# Patient Record
Sex: Male | Born: 1950 | Race: White | Hispanic: No | Smoking: Never smoker
Health system: Southern US, Community
[De-identification: ages and names within clinical notes are randomized; demographics above are authoritative.]

## PROBLEM LIST (undated history)

## (undated) DIAGNOSIS — L409 Psoriasis, unspecified: Secondary | ICD-10-CM

## (undated) DIAGNOSIS — E785 Hyperlipidemia, unspecified: Secondary | ICD-10-CM

## (undated) DIAGNOSIS — I8390 Asymptomatic varicose veins of unspecified lower extremity: Secondary | ICD-10-CM

## (undated) DIAGNOSIS — G3184 Mild cognitive impairment, so stated: Secondary | ICD-10-CM

## (undated) DIAGNOSIS — F329 Major depressive disorder, single episode, unspecified: Secondary | ICD-10-CM

## (undated) DIAGNOSIS — J189 Pneumonia, unspecified organism: Secondary | ICD-10-CM

## (undated) DIAGNOSIS — M23309 Other meniscus derangements, unspecified meniscus, unspecified knee: Secondary | ICD-10-CM

## (undated) DIAGNOSIS — N400 Enlarged prostate without lower urinary tract symptoms: Secondary | ICD-10-CM

## (undated) DIAGNOSIS — I10 Essential (primary) hypertension: Secondary | ICD-10-CM

## (undated) HISTORY — DX: Asymptomatic varicose veins of unspecified lower extremity: I83.90

## (undated) HISTORY — DX: Psoriasis, unspecified: L40.9

## (undated) HISTORY — PX: APPENDECTOMY: SHX54

## (undated) HISTORY — DX: Other meniscus derangements, unspecified meniscus, unspecified knee: M23.309

## (undated) HISTORY — PX: LOBECTOMY: SHX5089

---

## 1992-01-04 HISTORY — PX: OTHER SURGICAL HISTORY: SHX169

## 2000-11-10 ENCOUNTER — Encounter: Payer: Self-pay | Admitting: Geriatric Medicine

## 2000-11-10 ENCOUNTER — Encounter: Admission: RE | Admit: 2000-11-10 | Discharge: 2000-11-10 | Payer: Self-pay | Admitting: Geriatric Medicine

## 2000-11-16 ENCOUNTER — Encounter: Payer: Self-pay | Admitting: Geriatric Medicine

## 2000-11-16 ENCOUNTER — Encounter: Admission: RE | Admit: 2000-11-16 | Discharge: 2000-11-16 | Payer: Self-pay | Admitting: Geriatric Medicine

## 2000-12-18 ENCOUNTER — Ambulatory Visit (HOSPITAL_COMMUNITY): Admission: RE | Admit: 2000-12-18 | Discharge: 2000-12-18 | Payer: Self-pay | Admitting: Gastroenterology

## 2003-09-23 ENCOUNTER — Ambulatory Visit (HOSPITAL_COMMUNITY): Admission: RE | Admit: 2003-09-23 | Discharge: 2003-09-23 | Payer: Self-pay | Admitting: Geriatric Medicine

## 2003-12-02 ENCOUNTER — Ambulatory Visit (HOSPITAL_COMMUNITY): Admission: RE | Admit: 2003-12-02 | Discharge: 2003-12-02 | Payer: Self-pay | Admitting: Neurology

## 2003-12-10 ENCOUNTER — Encounter: Admission: RE | Admit: 2003-12-10 | Discharge: 2003-12-10 | Payer: Self-pay | Admitting: Neurology

## 2004-07-20 ENCOUNTER — Encounter: Admission: RE | Admit: 2004-07-20 | Discharge: 2004-07-20 | Payer: Self-pay | Admitting: Geriatric Medicine

## 2005-12-08 ENCOUNTER — Encounter: Admission: RE | Admit: 2005-12-08 | Discharge: 2005-12-08 | Payer: Self-pay | Admitting: Geriatric Medicine

## 2009-01-03 HISTORY — PX: CHOLECYSTECTOMY: SHX55

## 2009-03-24 ENCOUNTER — Inpatient Hospital Stay (HOSPITAL_COMMUNITY): Admission: EM | Admit: 2009-03-24 | Discharge: 2009-03-26 | Payer: Self-pay | Admitting: Emergency Medicine

## 2009-03-25 ENCOUNTER — Encounter (INDEPENDENT_AMBULATORY_CARE_PROVIDER_SITE_OTHER): Payer: Self-pay | Admitting: Surgery

## 2010-01-23 ENCOUNTER — Encounter: Payer: Self-pay | Admitting: Neurology

## 2010-03-29 LAB — CBC
Hemoglobin: 14.4 g/dL (ref 13.0–17.0)
Hemoglobin: 16 g/dL (ref 13.0–17.0)
MCHC: 33.6 g/dL (ref 30.0–36.0)
MCHC: 34 g/dL (ref 30.0–36.0)
Platelets: 145 10*3/uL — ABNORMAL LOW (ref 150–400)
RBC: 4.61 MIL/uL (ref 4.22–5.81)
RDW: 13.6 % (ref 11.5–15.5)

## 2010-03-29 LAB — COMPREHENSIVE METABOLIC PANEL
ALT: 283 U/L — ABNORMAL HIGH (ref 0–53)
ALT: 355 U/L — ABNORMAL HIGH (ref 0–53)
AST: 226 U/L — ABNORMAL HIGH (ref 0–37)
Alkaline Phosphatase: 106 U/L (ref 39–117)
BUN: 5 mg/dL — ABNORMAL LOW (ref 6–23)
CO2: 24 mEq/L (ref 19–32)
CO2: 28 mEq/L (ref 19–32)
Calcium: 8.7 mg/dL (ref 8.4–10.5)
Chloride: 104 mEq/L (ref 96–112)
Chloride: 107 mEq/L (ref 96–112)
Creatinine, Ser: 1.1 mg/dL (ref 0.4–1.5)
GFR calc Af Amer: >60 mL/min (ref >60)
GFR calc non Af Amer: >60 mL/min (ref >60)
Glucose, Bld: 102 mg/dL — ABNORMAL HIGH (ref 70–99)
Glucose, Bld: 123 mg/dL — ABNORMAL HIGH (ref 70–99)
Potassium: 3.6 mEq/L (ref 3.5–5.1)
Potassium: 3.7 mEq/L (ref 3.5–5.1)
Sodium: 142 mEq/L (ref 135–145)
Total Bilirubin: 2.4 mg/dL — ABNORMAL HIGH (ref 0.3–1.2)
Total Protein: 6.4 g/dL (ref 6.0–8.3)

## 2010-03-29 LAB — POTASSIUM: Potassium: 3.9 mEq/L (ref 3.5–5.1)

## 2010-03-29 LAB — LIPASE, BLOOD: Lipase: 31 U/L (ref 11–59)

## 2010-03-29 LAB — URINALYSIS, ROUTINE W REFLEX MICROSCOPIC
Ketones, ur: NEGATIVE mg/dL
Nitrite: NEGATIVE
Protein, ur: 100 mg/dL — AB
Urobilinogen, UA: 1 mg/dL (ref 0.0–1.0)

## 2010-03-29 LAB — DIFFERENTIAL
Basophils Absolute: 0 10*3/uL (ref 0.0–0.1)
Basophils Relative: 0 % (ref 0–1)
Lymphocytes Relative: 11 % — ABNORMAL LOW (ref 12–46)
Monocytes Absolute: 0.4 10*3/uL (ref 0.1–1.0)
Monocytes Relative: 5 % (ref 3–12)
Neutro Abs: 7.4 10*3/uL (ref 1.7–7.7)

## 2010-03-29 LAB — URINE MICROSCOPIC-ADD ON

## 2010-03-29 LAB — POCT CARDIAC MARKERS: Myoglobin, poc: 210 ng/mL (ref 12–200)

## 2010-03-29 LAB — HEPATIC FUNCTION PANEL
ALT: 276 U/L — ABNORMAL HIGH (ref 0–53)
Total Bilirubin: 1.9 mg/dL — ABNORMAL HIGH (ref 0.3–1.2)

## 2010-05-21 NOTE — Procedures (Signed)
[facility]  Patient:    Scott Ibarra, WONG Visit Number: [id number] MRN: [id number]          Service Type: Attending:  Verlin Grills, M.D. Dictated by:   Verlin Grills, M.D. Proc. Date: 12/18/00   CC:         Hal T. Stoneking, M.D.   Procedure Report  PROCEDURE:  Screening colonoscopy.  REFERRING PHYSICIAN:  Hal T. Stoneking, M.D.  INDICATIONS FOR PROCEDURE:  The patient (date of birth, 20-Oct-1950) is a 60 year old male who is due for his first screening colonoscopy with polypectomy to prevent colon cancer.  The patients father died of rectal cancer in his mid 52s.  I discussed with the patient the complications associated with colonoscopy and polypectomy including a 15 per 1000 risk of bleeding and 4 per 1000 risk of colon perforation requiring surgical repair.  The patient has signed the operative permit.  ENDOSCOPIST:  Verlin Grills, M.D.  PREMEDICATION:  Demerol 50 mg and Versed 7.5 mg.  ENDOSCOPE:  Olympus pediatric colonoscope.  DESCRIPTION OF PROCEDURE:  After obtaining informed consent, the patient was placed in the left lateral decubitus position.  I administered intravenous Versed and intravenous Demerol to achieve conscious sedation for the procedure.  The patients blood pressure, oxygen saturation, and cardiac rhythm were monitored throughout the procedure, and documented in the medical record.  Anal inspection was normal.  Digital rectal exam revealed a non-nodular prostate.  The Olympus pediatric video colonoscope was introduced into the rectum and easily advanced to the cecum.  Colonic preparation for the exam today was excellent.  Rectum normal.  Sigmoid colon and descending colon:  Left colonic diverticulosis.  Splenic flexure normal.  Transverse colon normal.  Hepatic flexure normal.  Ascending colon normal.  Cecum and ileocecal valve normal.  ASSESSMENT:  Normal proctocolonoscopy to  the cecum except for the presence of left colonic diverticulosis.  No endoscopic evidence for the presence of colorectal neoplasia.  RECOMMENDATIONS:  Repeat colonoscopy in approximately five years. Dictated by:   Verlin Grills, M.D. Attending:  Verlin Grills, M.D. DD:  12/18/00 TD:  12/19/00 Job: [id number]

## 2010-07-22 ENCOUNTER — Other Ambulatory Visit: Payer: Self-pay | Admitting: Gastroenterology

## 2010-07-22 ENCOUNTER — Ambulatory Visit (HOSPITAL_COMMUNITY)
Admission: RE | Admit: 2010-07-22 | Discharge: 2010-07-22 | Disposition: A | Discharge status: Home / Self Care | Payer: Managed Care, Other (non HMO) | Source: Ambulatory Visit | Attending: Gastroenterology | Admitting: Gastroenterology

## 2010-07-22 DIAGNOSIS — K621 Rectal polyp: Secondary | ICD-10-CM | POA: Insufficient documentation

## 2010-07-22 DIAGNOSIS — K620 Anal polyp: Secondary | ICD-10-CM | POA: Insufficient documentation

## 2010-07-22 DIAGNOSIS — K5730 Diverticulosis of large intestine without perforation or abscess without bleeding: Secondary | ICD-10-CM | POA: Insufficient documentation

## 2010-07-22 DIAGNOSIS — D126 Benign neoplasm of colon, unspecified: Secondary | ICD-10-CM | POA: Insufficient documentation

## 2010-08-05 NOTE — Op Note (Signed)
  Scott Ibarra, Scott Ibarra              ACCOUNT NO.:  [id number]  MEDICAL RECORD NO.:  [id number]  LOCATION:  [facility]                         FACILITY:  [facility]  PHYSICIAN:  Danise Edge, M.D.   DATE OF BIRTH:  1950-02-03  DATE OF PROCEDURE:  07/22/2010 DATE OF DISCHARGE:                              OPERATIVE REPORT   REFERRING PHYSICIAN:  Hal T. Stoneking, M.D.  HISTORY:  [name] is a 60 year old male born [birth year]. The patient is scheduled to undergo a surveillance colonoscopy with polypectomy to prevent colon cancer.  On June 13, 2005, the patient underwent a screening colonoscopy with removal of a diminutive polyp from the sigmoid colon and a diminutive polyp from the rectum.  ENDOSCOPIST:  Danise Edge, M.D.  PREMEDICATIONS:  Fentanyl 75 mcg, Versed 5 mg.  PROCEDURE:  After obtaining informed consent, the patient was placed in the left lateral decubitus position.  Anal inspection and digital rectal exam were normal.  The Pentax pediatric colonoscope was introduced into the rectum and easily advanced to the cecum.  A normal-appearing ileocecal valve and appendiceal orifice were identified.  Colonic preparation for the exam today was good.  Rectum.  A 3-mm sessile polyp was removed from the distal rectum.  Sigmoid colon.  Colonic diverticulosis.  From the mid sigmoid colon, a 3- mm sessile polyp was removed with cold biopsy forceps.  Descending colon.  Colonic diverticulosis.  Splenic flexure normal.  Transverse colon.  Colonic diverticulosis.  Hepatic flexure normal.  Ascending colon.  Colonic diverticulosis.  From the distal ascending colon, a 3-mm sessile polyp was removed with cold biopsy forceps.  Cecum and ileocecal valve normal.  ASSESSMENT: 1. Universal colonic diverticulosis. 2. A diminutive polyp was removed from the distal ascending colon, mid     sigmoid colon, and distal rectum.  RECOMMENDATIONS:  Repeat surveillance colonoscopy in 5  years.          ______________________________ Danise Edge, M.D.     MJ/MEDQ  D:  07/22/2010  T:  07/22/2010  Job:  [id number]  cc:   Hal T. Stoneking, M.D. Fax: [phone]  Electronically Signed by Danise Edge M.D. on 08/05/2010 04:13:44 PM

## 2011-01-04 HISTORY — PX: VARICOSE VEIN SURGERY: SHX832

## 2011-03-15 IMAGING — CT CT ABD-PELV W/ CM
1 of 3 series · 14 of 32 positions shown, 19 images · IV contrast (agent unspecified)
Comparison: None.

CLINICAL DATA: Upper abdominal pain with nausea.

CT ABDOMEN AND PELVIS WITH CONTRAST
TECHNIQUE: Multidetector CT imaging of the abdomen and pelvis was
performed following the standard protocol during bolus
administration of intravenous contrast.
Contrast: 100 ml [id number]-[id number] IV.

[Series 2: abd/pelv with 5.0 b31f st · axial · 0.93mm/px · z∈[-360,+60]mm · 14 of 94 slices shown, 19 images]
[im 5/94  soft-tissue]
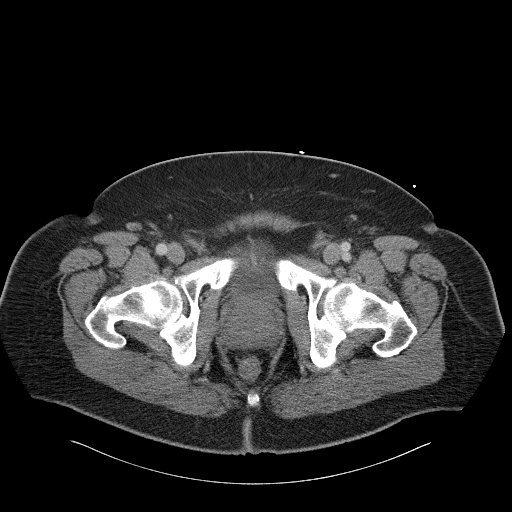
[im 5/94  bone]
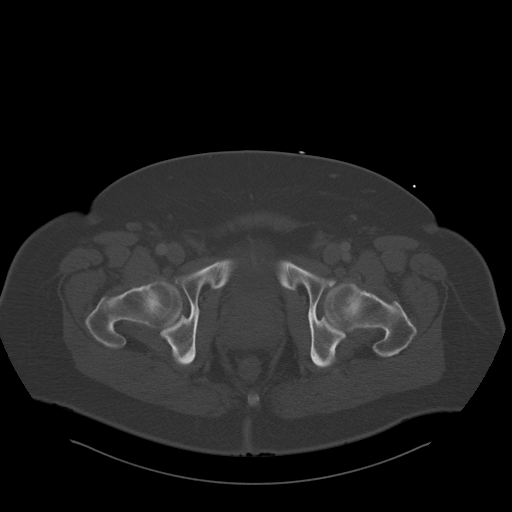
[im 15/94  soft-tissue]
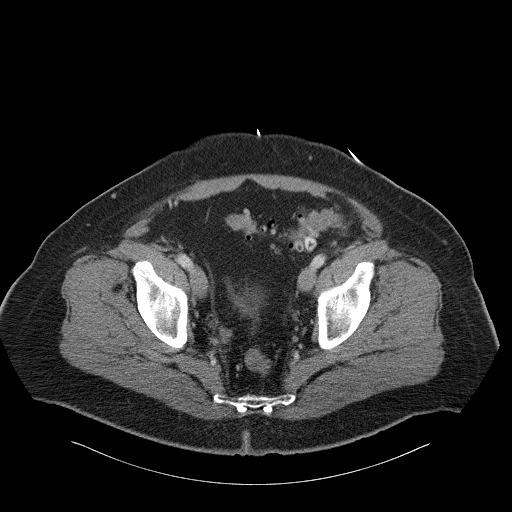
[im 20/94  soft-tissue]
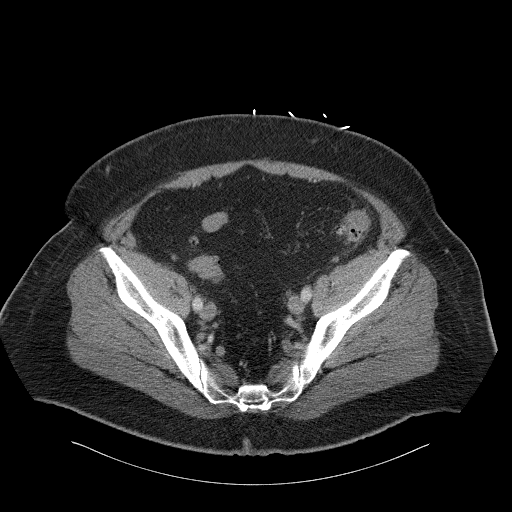
[im 25/94  soft-tissue]
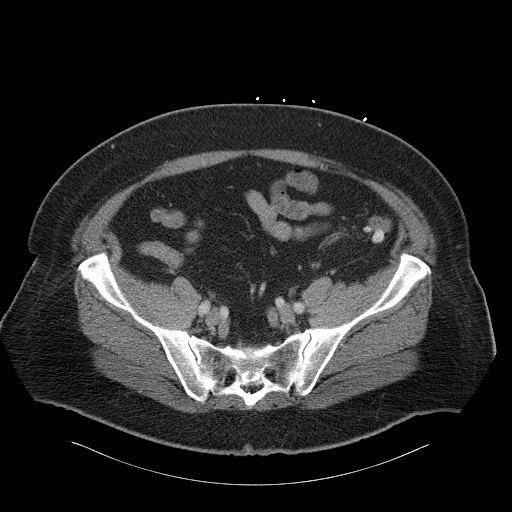
[im 35/94  soft-tissue]
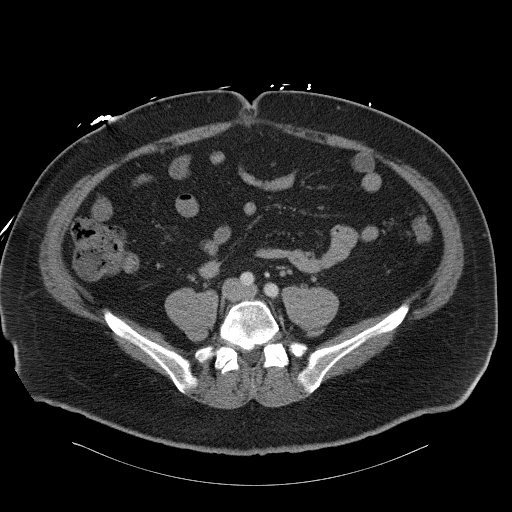
[im 40/94  soft-tissue]
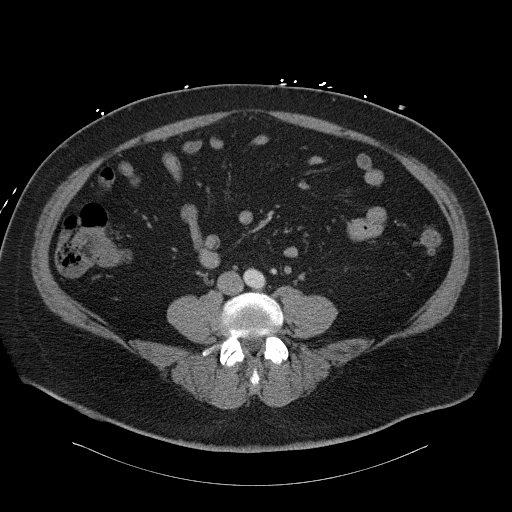
[im 49/94  soft-tissue]
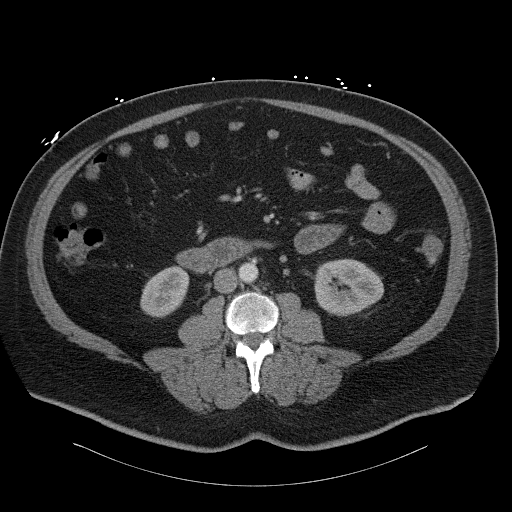
[im 54/94  soft-tissue]
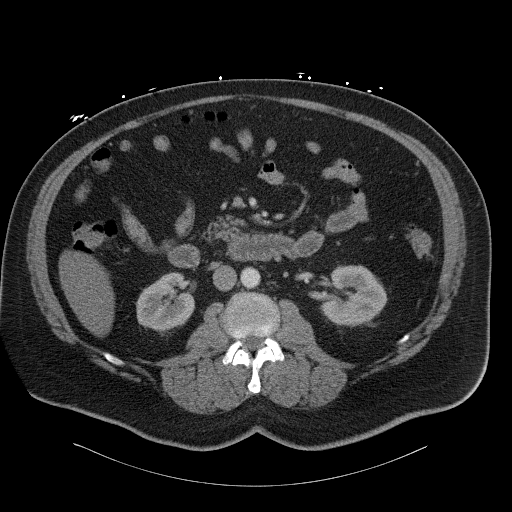
[im 59/94  soft-tissue]
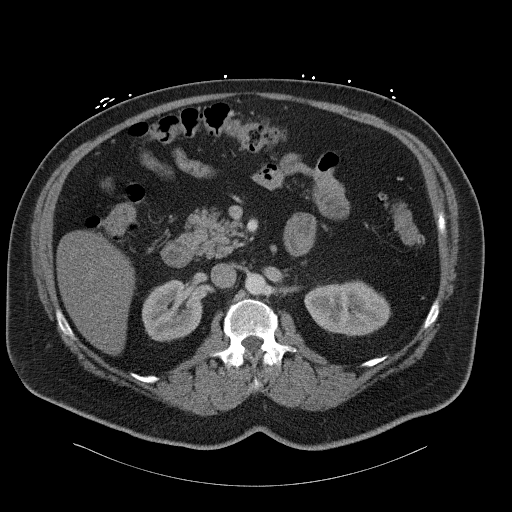
[im 59/94  bone]
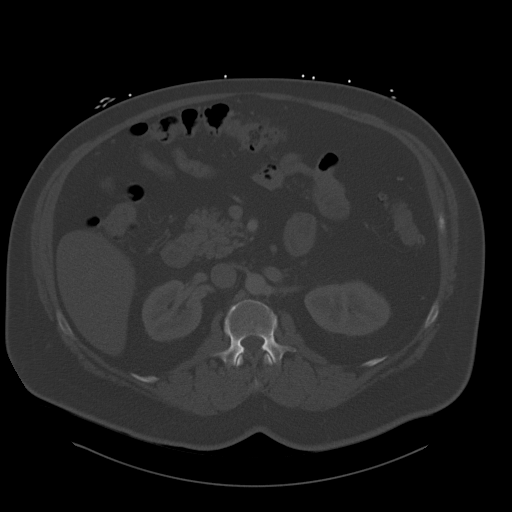
[im 69/94  soft-tissue]
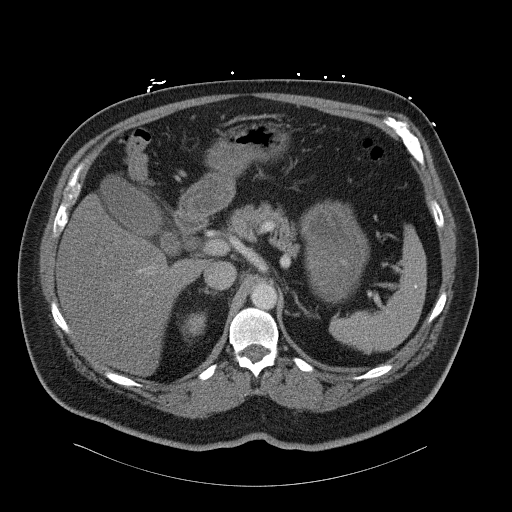
[im 74/94  soft-tissue]
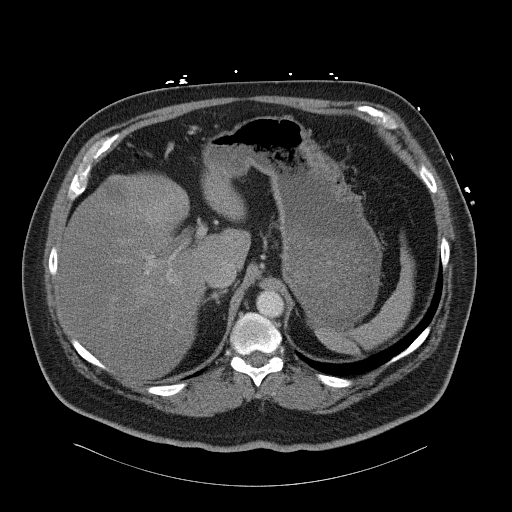
[im 74/94  lung]
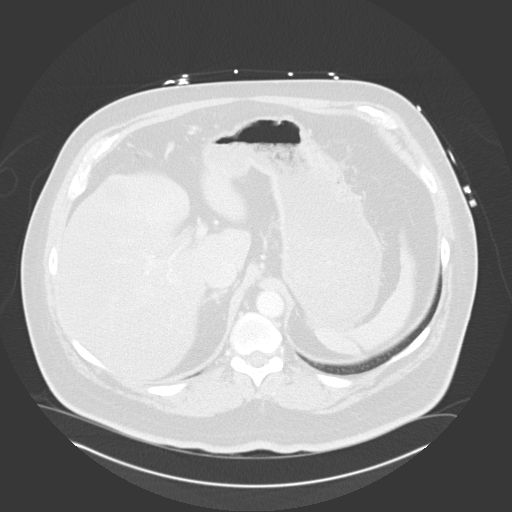
[im 79/94  soft-tissue]
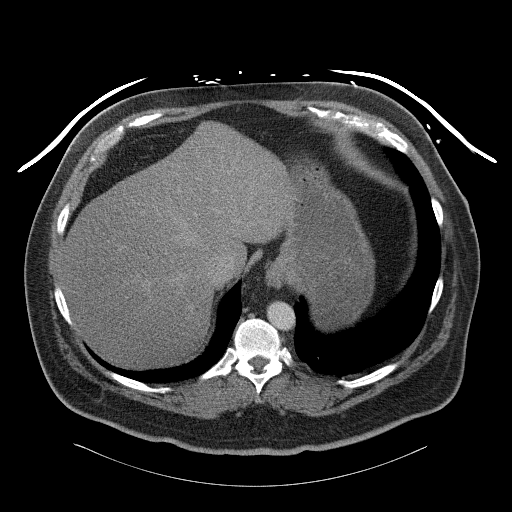
[im 79/94  lung]
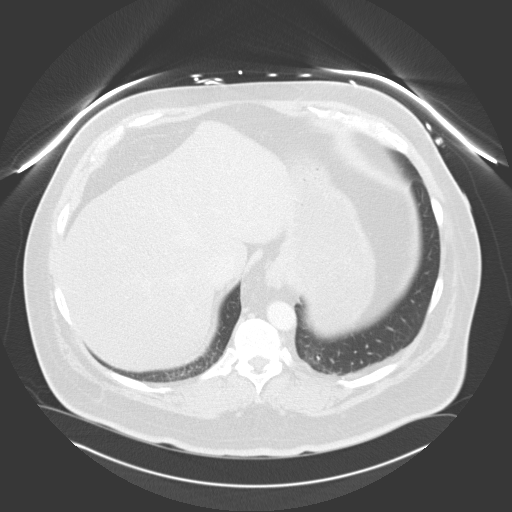
[im 84/94  lung]
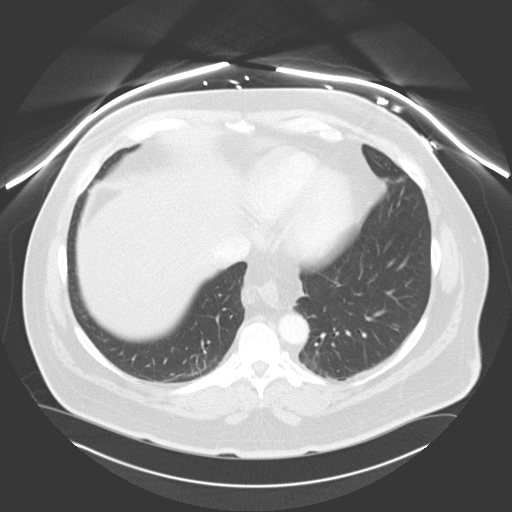
[im 89/94  soft-tissue]
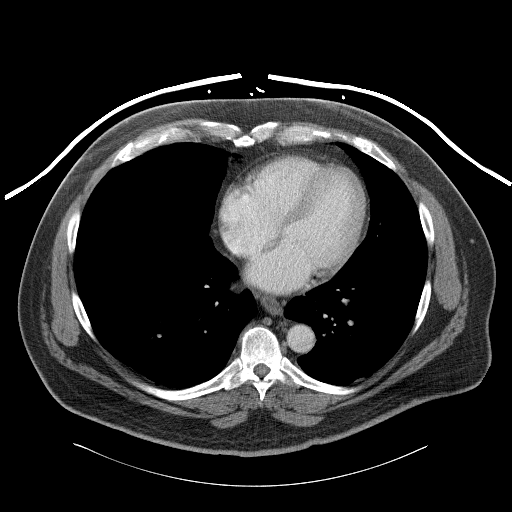
[im 89/94  lung]
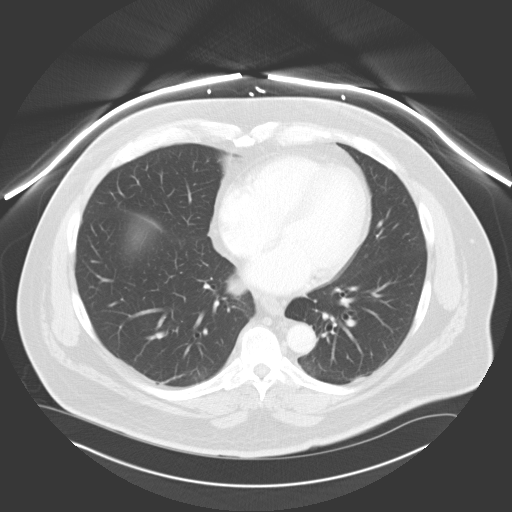

[14 of 32 positions shown; findings below may reference images not displayed]

FINDINGS: There is diffuse fatty infiltration of the liver.  No
hepatic space-occupying lesions.  There is edema surrounding the
gallbladder worrisome for acute cholecystitis.  There is no free
pericholecystic fluid.  There are low-density gallstones.  No
biliary ductal dilatation.

 Calcified splenic granulomata. Calcified granuloma in the right
middle lobe. Small hiatal hernia.  Negative pancreas, kidneys, and
adrenal glands.

Scattered colonic diverticula.  Very subtle hazy density adjacent
to the distal descending colon is suspicious for mild
diverticulitis.  No paracolic abscess or evidence for perforation.
No free fluid.

Prostate gland is enlarged measuring 5.9 cm in diameter.  Seminal
vesicles and bladder appear unremarkable.
IMPRESSION: Findings consistent with acute cholecystitis. Cholelithiasis.

Findings suspicious for mild diverticulitis involving the distal
descending - proximal sigmoid colon.

## 2011-08-12 ENCOUNTER — Other Ambulatory Visit: Payer: Self-pay

## 2011-08-12 DIAGNOSIS — I83893 Varicose veins of bilateral lower extremities with other complications: Secondary | ICD-10-CM

## 2011-08-22 ENCOUNTER — Encounter: Payer: Managed Care, Other (non HMO) | Admitting: Vascular Surgery

## 2011-09-19 ENCOUNTER — Encounter: Payer: Self-pay | Admitting: Vascular Surgery

## 2011-09-20 ENCOUNTER — Ambulatory Visit (INDEPENDENT_AMBULATORY_CARE_PROVIDER_SITE_OTHER): Payer: Managed Care, Other (non HMO) | Admitting: Vascular Surgery

## 2011-09-20 ENCOUNTER — Encounter (INDEPENDENT_AMBULATORY_CARE_PROVIDER_SITE_OTHER): Payer: Managed Care, Other (non HMO) | Admitting: *Deleted

## 2011-09-20 ENCOUNTER — Encounter: Payer: Self-pay | Admitting: Vascular Surgery

## 2011-09-20 ENCOUNTER — Other Ambulatory Visit: Payer: Self-pay | Admitting: *Deleted

## 2011-09-20 VITALS — BP 150/99 | HR 71 | Resp 20 | Ht 74.0 in | Wt 293.0 lb

## 2011-09-20 DIAGNOSIS — I83893 Varicose veins of bilateral lower extremities with other complications: Secondary | ICD-10-CM

## 2011-09-20 DIAGNOSIS — I8390 Asymptomatic varicose veins of unspecified lower extremity: Secondary | ICD-10-CM

## 2011-09-20 NOTE — Progress Notes (Signed)
Subjective:     Patient ID: ZOE NORDIN, male   DOB: Nov 28, 1950, 61 y.o.   MRN: [id number]  HPI this 61 year old male presents with bulging varicose veins which are becoming increasingly symptomatic in the left lower thigh and calf. Patient had a vein stripping performed many years ago on the left leg. He was asymptomatic for several years but has developed recurrent varicosities and swelling in the left calf and ankle which worsens as the day progresses. He is beginning to affect his daily living and ability to work. It is not wear elastic compression stockings. He does try to elevate his legs some at work. He has no history of DVT, thrombophlebitis, venous stasis ulcers, or bleeding. He has no symptoms in the right leg.  Past Medical History  Diagnosis Date  . Varicose veins     History  Substance Use Topics  . Smoking status: Never Smoker   . Smokeless tobacco: Not on file  . Alcohol Use: No    Family History  Problem Relation Age of Onset  . Stroke Mother   . Cancer Father     No Known Allergies  Current outpatient prescriptions:Dutasteride-Tamsulosin HCl (JALYN) 0.5-0.4 MG CAPS, Take by mouth., Disp: , Rfl:   BP 150/99  Pulse 71  Resp 20  Ht 6\' 2"  (1.88 m)  Wt 293 lb (132.904 kg)  BMI 37.62 kg/m2  Body mass index is 37.62 kg/(m^2).           Review of Systems denies chest pain, dyspnea on exertion, PND, orthopnea, hemoptysis, lateralizing weakness. Does have history of psoriasis and varicose veins with edema other systems are negative and complete review of systems     Objective:   Physical Exam blood pressure 150/99 heart rate 71 respirations 20 Gen.-alert and oriented x3 in no apparent distress HEENT normal for age Lungs no rhonchi or wheezing Cardiovascular regular rhythm no murmurs carotid pulses 3+ palpable no bruits audible Abdomen soft nontender no palpable masses Musculoskeletal free of  major deformities Skin clear -no rashes Neurologic  normal Lower extremities 3+ femoral and dorsalis pedis pulses palpable bilaterally with 1+ edema left calf ankle and leg. Mild hyperpigmentation distally in the left ankle. There bulging varicosities in the left distal thigh medially of the great saphenous vein in the proximal medial calf. Right leg is free varicosities.  Today I ordered a venous duplex exam which I reviewed and interpreted. There is gross reflux in the left great saphenous vein from supplying these varicosities and no DVT is noted. The left small saphenous vein is unremarkable.       Assessment:     Severe venous insufficiency left leg with gross reflux left great saphenous vein with bulging symptomatic varicosities-affecting patient's daily living and ability to work    Plan:     #1 long-leg elastic compression stockings 20-30 mm gradient #2 elevate legs as much as possible during the day #3 ibuprofen on a daily basis #4 return to see me in 3 months. If not dramatic improvement in symptoms the only laser ablation left great saphenous vein with 10-20 stab phlebectomy as single procedure signed

## 2011-11-22 ENCOUNTER — Telehealth: Payer: Self-pay | Admitting: *Deleted

## 2011-11-22 NOTE — Telephone Encounter (Signed)
Spoke with the patient's wife and told her with her husband coming 12/17 there may not be time to get his procedure approved before 01/02/12. She became very upset. I explained I just wanted to give her a heads up that there may be a problem and that we would try to do the best we could. She hung up on me.

## 2011-12-19 ENCOUNTER — Encounter: Payer: Self-pay | Admitting: Vascular Surgery

## 2011-12-20 ENCOUNTER — Ambulatory Visit (INDEPENDENT_AMBULATORY_CARE_PROVIDER_SITE_OTHER): Payer: Managed Care, Other (non HMO) | Admitting: Vascular Surgery

## 2011-12-20 ENCOUNTER — Encounter: Payer: Self-pay | Admitting: Vascular Surgery

## 2011-12-20 VITALS — BP 162/96 | HR 85 | Resp 18 | Ht 74.0 in | Wt 289.0 lb

## 2011-12-20 DIAGNOSIS — I83893 Varicose veins of bilateral lower extremities with other complications: Secondary | ICD-10-CM

## 2011-12-20 NOTE — Progress Notes (Signed)
Subjective:     Patient ID: Scott Ibarra, male   DOB: 17-Mar-1950, 61 y.o.   MRN: [id number]  HPI this 61 year old male returns for continued followup regarding his venous insufficiency of the left leg. He has painful varicosities in the anterior distal thigh. He has been wearing long leg elastic compression stockings 20-30 mm gradient and trying elevation and ibuprofen on a daily basis with no success. He continues to have aching throbbing and burning discomfort which worsens as the day progresses. He also has distal edema. He has a remote history of stripping of the left great saphenous vein in 1994. He has documented gross reflux in the anterior accessory branch of the left great saphenous vein currently which is supplying these symptomatic bulging varicosities. These are affecting his daily living and resistant to conservative measures.  Past Medical History  Diagnosis Date  . Varicose veins     History  Substance Use Topics  . Smoking status: Never Smoker   . Smokeless tobacco: Not on file  . Alcohol Use: No    Family History  Problem Relation Age of Onset  . Stroke Mother   . Cancer Father     No Known Allergies  Current outpatient prescriptions:naproxen (NAPROSYN) 500 MG tablet, Take 500 mg by mouth 2 (two) times daily with a meal., Disp: , Rfl: ;  Dutasteride-Tamsulosin HCl (JALYN) 0.5-0.4 MG CAPS, Take by mouth., Disp: , Rfl:   BP 162/96  Pulse 85  Resp 18  Ht 6\' 2"  (1.88 m)  Wt 289 lb (131.09 kg)  BMI 37.11 kg/m2  Body mass index is 37.11 kg/(m^2).          Review of Systems denies chest pain, dyspnea on exertion, PND, orthopnea, hemoptysis    Objective:   Physical Exam blood pressure 162/96 heart rate 85 respirations 18 General well-developed well-nourished male no apparent stress alert and oriented x3 Lungs no rhonchi or wheezing Left lower extremity with bulging varicosities in the distal anterior thigh extending medially into the proximal medial calf  with 1+ distal edema. No active ulcerations noted. 3+ dorsalis pedis pulses palpable.       Assessment:     Painful varicosities left leg secondary to reflux anterior accessory branch left great saphenous vein with previous vein stripping 1994 left great saphenous vein and main trunk-varicosities resistant to conservative measures including long-leg elastic compression stockings 20-30 mm gradient, elevation, and ibuprofen    Plan:     Patient needs a laser ablation anterior chest or branch left great saphenous vein with 10-20 stab phlebectomy for painful varicosities. Will proceed with precertification to perform this in the near future to relieve his symptoms

## 2011-12-21 ENCOUNTER — Other Ambulatory Visit: Payer: Self-pay | Admitting: *Deleted

## 2011-12-21 DIAGNOSIS — I83893 Varicose veins of bilateral lower extremities with other complications: Secondary | ICD-10-CM

## 2011-12-30 ENCOUNTER — Encounter: Payer: Self-pay | Admitting: Vascular Surgery

## 2012-01-02 ENCOUNTER — Encounter: Payer: Self-pay | Admitting: Vascular Surgery

## 2012-01-02 ENCOUNTER — Other Ambulatory Visit: Payer: Managed Care, Other (non HMO) | Admitting: Vascular Surgery

## 2012-01-02 ENCOUNTER — Ambulatory Visit (INDEPENDENT_AMBULATORY_CARE_PROVIDER_SITE_OTHER): Payer: Managed Care, Other (non HMO) | Admitting: Vascular Surgery

## 2012-01-02 VITALS — BP 148/92 | HR 86 | Resp 18 | Ht 74.0 in | Wt 289.0 lb

## 2012-01-02 DIAGNOSIS — I83893 Varicose veins of bilateral lower extremities with other complications: Secondary | ICD-10-CM

## 2012-01-02 NOTE — Progress Notes (Signed)
Laser Ablation Procedure      Date: 01/02/2012    Scott Ibarra DOB:11/18/1950  Consent signed: Yes  Surgeon:J.D. Hart [place]  Procedure: Laser Ablation: left Greater Saphenous Vein  BP 148/92  Pulse 86  Resp 18  Ht 6\' 2"  (1.88 m)  Wt 289 lb (131.09 kg)  BMI 37.11 kg/m2  Start time: 2:50   End time: 3:50  Tumescent Anesthesia: 450 cc 0.9% NaCl with 50 cc Lidocaine HCL with 1% Epi and 15 cc 8.4% NaHCO3  Local Anesthesia: 10 cc Lidocaine HCL and NaHCO3 (ratio 2:1)  Pulsed mode: Watts 15 Seconds 1 Pulses:1 Total Pulses:114 Total Energy: 1710 Total Time: 1:54     Stab Phlebectomy: 10-20 Sites: Thigh and Calf  Patient tolerated procedure well: Yes  Notes:   Description of Procedure:  After marking the course of the saphenous vein and the secondary varicosities in the standing position, the patient was placed on the operating table in the supine position, and the left leg was prepped and draped in sterile fashion. Local anesthetic was administered, and under ultrasound guidance the saphenous vein was accessed with a micro needle and guide wire; then the micro puncture sheath was placed. A guide wire was inserted to the saphenofemoral junction, followed by a 5 french sheath.  The position of the sheath and then the laser fiber below the junction was confirmed using the ultrasound and visualization of the aiming beam.  Tumescent anesthesia was administered along the course of the saphenous vein using ultrasound guidance. Protective laser glasses were placed on the patient, and the laser was fired at 15 watt pulsed mode advancing 1-2 mm per sec.  For a total of 1710 joules.  A steri strip was applied to the puncture site.  The patient was then put into Trendelenburg position.  Local anesthetic was utilized overlying the marked varicosities.  Ten to 20 stab wounds were made using the tip of an 11 blade; and using the vein hook,  The phlebectomies were performed using a hemostat to avulse  these varicosities.  Adequate hemostasis was achieved, and steri strips were applied to the stab wound.      ABD pads and thigh high compression stockings were applied.  Ace wrap bandages were applied over the phlebectomy sites and at the top of the saphenofemoral junction.  Blood loss was less than 15 cc.  The patient ambulated out of the operating room having tolerated the procedure well.

## 2012-01-02 NOTE — Progress Notes (Signed)
Subjective:     Patient ID: Scott Ibarra, male   DOB: 01/14/1950, 61 y.o.   MRN: [id number]  HPI this 61 year old male had laser ablation of the left great saphenous vein +10-20 stab phlebectomy of secondary painful varicosities performed under local tumescent anesthesia. A total of 1700 J of energy was utilized. He tolerated the procedure well.  Review of Systems     Objective:   Physical ExamBP 148/92  Pulse 86  Resp 18  Ht 6\' 2"  (1.88 m)  Wt 289 lb (131.09 kg)  BMI 37.11 kg/m2       Assessment:    well-tolerated laser ablation left great saphenous vein +10-20 stab phlebectomy    Plan:     Return 01/10/2012 for venous duplex exam to confirm closure left great saphenous vein

## 2012-01-03 ENCOUNTER — Telehealth: Payer: Self-pay | Admitting: *Deleted

## 2012-01-03 NOTE — Telephone Encounter (Signed)
Reached patient at home. Doing well. No bleeding and only slight discomfort. Reminded him of his fu appt next week.

## 2012-01-09 ENCOUNTER — Encounter: Payer: Self-pay | Admitting: Vascular Surgery

## 2012-01-10 ENCOUNTER — Encounter (INDEPENDENT_AMBULATORY_CARE_PROVIDER_SITE_OTHER): Payer: Managed Care, Other (non HMO) | Admitting: *Deleted

## 2012-01-10 ENCOUNTER — Encounter: Payer: Self-pay | Admitting: Vascular Surgery

## 2012-01-10 ENCOUNTER — Ambulatory Visit (INDEPENDENT_AMBULATORY_CARE_PROVIDER_SITE_OTHER): Payer: Managed Care, Other (non HMO) | Admitting: Vascular Surgery

## 2012-01-10 VITALS — BP 157/90 | HR 88 | Resp 16 | Ht 74.0 in | Wt 289.0 lb

## 2012-01-10 DIAGNOSIS — I83893 Varicose veins of bilateral lower extremities with other complications: Secondary | ICD-10-CM

## 2012-01-10 NOTE — Progress Notes (Signed)
Subjective:     Patient ID: Scott Ibarra, male   DOB: 01/16/1950, 62 y.o.   MRN: [id number]  HPI this 62 year old male returns 1 week post laser ablation left great saphenous vein with multiple stab phlebectomy for painful varicosities he did develop a skin rash in the proximal thigh beneath his elastic compression stocking. Interestingly he did not have a rash when he was wearing the stocking prior to the procedure. He has no history of latex allergy. He said no chills and fever. He has had decreased swelling in the leg.  Review of Systems     Objective:   Physical ExamBP 157/90  Pulse 88  Resp 16  Ht 6\' 2"  (1.88 m)  Wt 289 lb (131.09 kg)  BMI 37.11 kg/m2  General well-developed well-nourished male no apparent stress alert and oriented x3 And left lower extremity with a rash which is not circumferential that begins in the upper third of the left thigh which is maculopapular in characteristic. No open lesions are noted. Stab phlebectomy sites are well-healed. He has mild discomfort along the course of the great saphenous vein. 2+ dorsalis pedis pulse palpable.  Today I ordered venous duplex exam of the left leg which are reviewed and interpreted. GSC is totally occluded and the deep system is widely patent with no evidence of DVT    Assessment:     Successful laser ablation left great saphenous vein with multiple stab phlebectomy for venous hypertension and edema    Plan:     We'll DC elastic compression stocking because of rash and wrap leg with Ace wrap for one more week and then discontinue Return to see [place] on when necessary basis

## 2012-09-05 ENCOUNTER — Other Ambulatory Visit: Payer: Self-pay | Admitting: Geriatric Medicine

## 2012-09-05 DIAGNOSIS — R42 Dizziness and giddiness: Secondary | ICD-10-CM

## 2012-09-07 ENCOUNTER — Ambulatory Visit
Admission: RE | Admit: 2012-09-07 | Discharge: 2012-09-07 | Disposition: A | Discharge status: Home / Self Care | Payer: Managed Care, Other (non HMO) | Source: Ambulatory Visit | Attending: Geriatric Medicine | Admitting: Geriatric Medicine

## 2012-09-07 DIAGNOSIS — R42 Dizziness and giddiness: Secondary | ICD-10-CM

## 2012-09-07 MED ORDER — GADOBENATE DIMEGLUMINE 529 MG/ML IV SOLN
20.0000 mL | Freq: Once | INTRAVENOUS | Status: AC | PRN
  Administered 2012-09-07: 20 mL via INTRAVENOUS

## 2012-09-13 ENCOUNTER — Other Ambulatory Visit: Payer: Managed Care, Other (non HMO)

## 2013-01-03 HISTORY — PX: MENISCUS REPAIR: SHX5179

## 2014-05-19 ENCOUNTER — Ambulatory Visit (HOSPITAL_COMMUNITY)
Admission: RE | Admit: 2014-05-19 | Discharge: 2014-05-19 | Disposition: A | Discharge status: Home / Self Care | Payer: Medicare Other | Source: Ambulatory Visit | Attending: Cardiology | Admitting: Cardiology

## 2014-05-19 ENCOUNTER — Other Ambulatory Visit (HOSPITAL_COMMUNITY): Payer: Self-pay | Admitting: Orthopedic Surgery

## 2014-05-19 DIAGNOSIS — M79662 Pain in left lower leg: Secondary | ICD-10-CM | POA: Diagnosis not present

## 2014-05-19 DIAGNOSIS — R609 Edema, unspecified: Secondary | ICD-10-CM | POA: Diagnosis not present

## 2014-05-19 NOTE — Progress Notes (Signed)
Left Lower Ext. Venous Duplex Completed. Negative for DVT or SVT in the left lower extremity. Oda Cogan, BS, RDMS, RVT

## 2014-09-29 ENCOUNTER — Telehealth: Payer: Self-pay | Admitting: Neurology

## 2014-09-29 NOTE — Telephone Encounter (Signed)
Scott Ibarra - can you call and schedule patient for next week in a an appt time that is the last one of the day? Either a 3:30 or 4pm slot please. This is a new appointment.  If that is too late, a slot before lunch would be great. He will take some time.

## 2014-09-30 NOTE — Telephone Encounter (Signed)
Called and spoke w/ wife. Scheduled NP appt for 10/01/14 at 730am. Check in 715am. I offered 10/02/14 at 4pm after his daugter's appt, but she declined stating he has to work. She is going to call her husband and see if he can leave work early. She will call back if she wants to switch to 4pm appt slot.

## 2014-09-30 NOTE — Telephone Encounter (Signed)
Returned [employer] phone call. LVM to let her know to call back and advise what appt she would like for her husband. Told her she can let phone staff know. Gave [facility] phone number.

## 2014-09-30 NOTE — Telephone Encounter (Signed)
Wife [name] returned your call regarding appoinetment for spouse

## 2014-10-01 NOTE — Telephone Encounter (Signed)
Thanks

## 2014-10-01 NOTE — Telephone Encounter (Signed)
Moved pt appt to 10.6.16 at 4pm instead per wife request.

## 2014-10-01 NOTE — Telephone Encounter (Signed)
Patients wife called returning Scott Ibarra's phone call. She will take Thursday @ 4:00.

## 2014-10-08 ENCOUNTER — Ambulatory Visit: Payer: Self-pay | Admitting: Neurology

## 2014-10-09 ENCOUNTER — Ambulatory Visit (INDEPENDENT_AMBULATORY_CARE_PROVIDER_SITE_OTHER): Payer: Medicare Other | Admitting: Neurology

## 2014-10-09 ENCOUNTER — Encounter: Payer: Self-pay | Admitting: Neurology

## 2014-10-09 VITALS — BP 147/88 | HR 72 | Ht 74.0 in | Wt 247.0 lb

## 2014-10-09 DIAGNOSIS — G243 Spasmodic torticollis: Secondary | ICD-10-CM

## 2014-10-09 NOTE — Progress Notes (Signed)
[facility]    Provider:  Dr Jaynee Eagles Referring Provider: [name]* Primary Care Physician:  [place]  CC:  Neck pain and headache  HPI:  Scott Ibarra is a 64 y.o. male here as a referral from Dr. Sharmaine Base for neck pain. The pain started in October of 2005. No trauma, started slowly and getting worse. He has tried multiple medications. He tried massages, muscle relaxers, lidocaine patches, physical therapy. He has decreased ROM.  Slowly progressive. Tender to touch on the left side of his neck and painful to palpation. He feels his muscles are big. The pain is intense. Pain is continuous now all the time. The pain radiates into the head. He doesn't have migraines or headaches. When the muscles on the right of the neck flare up it can be intense. He feels his neck is stiff, can't move it with full range. Refractory to all medications and procedures.   Reviewed notes, labs and imaging from outside physicians, which showed:  MRi cervical spine 12/2005:  IMPRESSION:  Chronic degenerative disk disease with posterior spurring at C3-4, asymmetric to the right into the right lateral recess. However, the neural foramina are not significantly narrowed. The spurs could possibly affect the central nerve rootlets of C4 or C5. However, there has been no significant change.  MRI brain 09/2012:  Findings: No acute infarct. No intracranial hemorrhage. No intracranial mass or abnormal enhancement. Mild atrophy without hydrocephalus. Major intracranial vascular structures are patent with small right vertebral artery once again noted.Minimal mucosal thickening ethmoid sinus air cells.Non dedicated imaging of the internal auditory canal region and orbital region unremarkable.Cervical medullary junction, pituitary region and pineal region unremarkable.  IMPRESSION: No acute abnormality noted as detailed above   Review of  Systems: Patient complains of symptoms per HPI as well as the following symptoms: ringing in ears, memory loss, confusion, headache, slurred speech. Pertinent negatives per HPI. All others negative.   Social History   Social History  . Marital Status: Married    Spouse Name: [name]  . Number of Children: N/A  . Years of Education: 14   Occupational History  . Catering manager- retired     Social History Main Topics  . Smoking status: Never Smoker   . Smokeless tobacco: Not on file  . Alcohol Use: No  . Drug Use: No  . Sexual Activity: Not on file   Other Topics Concern  . Not on file   Social History Narrative   Lives at home with wife.   Caffeine use: Drinks coffee/tea (2-3 cups per day)       Family History  Problem Relation Age of Onset  . Stroke Mother   . Cancer Father     Past Medical History  Diagnosis Date  . Varicose veins   . Psoriasis     Past Surgical History  Procedure Laterality Date  . Cholecystectomy  2011  . Ligation and stripping left  1994  . Varicose vein surgery  2013    Current Outpatient Prescriptions  Medication Sig Dispense Refill  . cyclobenzaprine (FLEXERIL) 5 MG tablet Take 5 mg by mouth 3 (three) times daily as needed for muscle spasms.    Marland Kitchen gabapentin (NEURONTIN) 100 MG capsule Take 100 mg by mouth as needed.    . naproxen (NAPROSYN) 500 MG tablet Take 500 mg by mouth 2 (two) times daily with a meal.    . topiramate (TOPAMAX) 25 MG tablet Take 25 mg by mouth  daily. 2 tablets at night     No current facility-administered medications for this visit.    Allergies as of 10/09/2014  . (No Known Allergies)    Vitals: BP 147/88 mmHg  Pulse 72  Ht 6\' 2"  (1.88 m)  Wt 247 lb (112.038 kg)  BMI 31.70 kg/m2 Last Weight:  Wt Readings from Last 1 Encounters:  10/09/14 247 lb (112.038 kg)   Last Height:   Ht Readings from Last 1 Encounters:  10/09/14 6\' 2"  (1.88 m)   Physical exam: Exam: Gen: NAD, conversant, well  nourised, well groomed                     CV: RRR, no MRG. No Carotid Bruits. No peripheral edema, warm, nontender Eyes: Conjunctivae clear without exudates or hemorrhage   NECK:  Hypertrophy of right-sided scalene and right-sided upper trapezius. Decreased ROM with turning head to the right.  Left torticollis and left laterocollis. Left shoulder elevation  Neuro: Detailed Neurologic Exam  Speech:    Speech is normal; fluent and spontaneous with normal comprehension.  Cognition:    The patient is oriented to person, place, and time;     recent and remote memory intact;     language fluent;     normal attention, concentration,     fund of knowledge Cranial Nerves:    The pupils are equal, round, and reactive to light. The fundi are FLAT Visual fields are full to finger confrontation. Extraocular movements are intact. Trigeminal sensation is intact and the muscles of mastication are normal. The face is symmetric. The palate elevates in the midline. Hearing intact. Voice is normal. Shoulder shrug is normal. The tongue has normal motion without fasciculations.   Coordination:    Normal finger to nose and heel to shin. Normal rapid alternating movements.   Gait:    Heel-toe and tandem gait are normal.   Motor Observation:    No asymmetry, no atrophy, and no involuntary movements noted. Tone:    Normal muscle tone.    Posture:    Posture is normal. normal erect    Strength:    Strength is V/V in the upper and lower limbs.      Sensation: intact to LT     Reflex Exam:  DTR's:    Deep tendon reflexes in the upper and lower extremities are normal bilaterally.   Toes:    The toes are downgoing bilaterally.   Clonus:    Clonus is absent.       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 Refractory to oral medications. reviewed w/ pt the procedure of botulinum toxin, incl side effects - localized weakness, inj site rxn, myalgia and spread from site of  injection   Units projected 100 Xeomin  Motrin / tylenol for injections site pain / soreness   REMS precautions handout given to patient   RTC - see instructions for details    Sarina Ill, MD  [facility] [address] [place], [place] [address]  Phone [phone] Fax [phone]

## 2014-10-12 DIAGNOSIS — G243 Spasmodic torticollis: Secondary | ICD-10-CM | POA: Insufficient documentation

## 2014-10-14 ENCOUNTER — Encounter: Payer: Self-pay | Admitting: Neurology

## 2014-10-14 ENCOUNTER — Ambulatory Visit (INDEPENDENT_AMBULATORY_CARE_PROVIDER_SITE_OTHER): Payer: Medicare Other | Admitting: Neurology

## 2014-10-14 VITALS — BP 140/77 | HR 86 | Ht 74.0 in | Wt 246.0 lb

## 2014-10-14 DIAGNOSIS — G243 Spasmodic torticollis: Secondary | ICD-10-CM

## 2014-10-14 NOTE — Progress Notes (Signed)
Botox 200  units Lot: [id number] Expiration: 08/2016 [id number]  0.9% Sodium Chloride - 74mL Lot: [id number] Expiration: 10/2016 [place]: [phone]

## 2014-10-19 NOTE — Progress Notes (Addendum)
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   Candidate for btx injections to reduce excessive tone and improve ROM, reduce pain and headache d/w pt btx side effects: injection site rxn, reversible weakness provided literature for pt to read    Botox procedure note  Initial  injections today. Refractory to oral medications.  reviewed w/ pt the procedure of botulinum toxin, incl side effects - localized weakness, inj site rxn, myalgia and spread from site of injection   EMG: EMG guidance was used to inject muscles detailed below. Aseptic procedure was performed and patient tolerated procedure. Procedure was performed by Dr. Myrla Halsted   Motrin / tylenol for injections site pain / soreness   REMS precautions handout given to patient   RTC - see instructions for details   NDC: [address] Botox 200 units used 60 units Wasted 140 Units.  J-CODE, [id number] Lot: [id number]  Expiration: 08/2016  [id number]  0.9% Sodium Chloride - 27mL  Lot: [id number]  Expiration: 10/2016  NHRIC: [id number]  Right upper trapezius total 30 units in 3 locations Right Scalene Medius 10 units Right SCM 10 Left Levator Scapulae 10

## 2015-02-03 ENCOUNTER — Ambulatory Visit (INDEPENDENT_AMBULATORY_CARE_PROVIDER_SITE_OTHER): Payer: Medicare Other | Admitting: Neurology

## 2015-02-03 DIAGNOSIS — G243 Spasmodic torticollis: Secondary | ICD-10-CM

## 2015-02-03 NOTE — Progress Notes (Signed)
Botox procedure note  Initial injections today. Refractory to oral medications.  reviewed w/ pt the procedure of botulinum toxin, incl side effects - localized weakness, inj site rxn, myalgia and spread from site of injection   EMG: EMG guidance was used to inject muscles detailed below. Aseptic procedure was performed and patient tolerated procedure. Procedure was performed by Dr. Myrla Halsted   Motrin / tylenol for injections site pain / soreness   REMS precautions handout given to patient   RTC - see instructions for details   NDC: [address] Botox-200units, 0 wasted J-CODE, [id number] Lot: [id number] Expiration: July 2019 [id number]  0.9% Sodium Chloride Lot: [id number] Expiration: 10/2015 NDC: [id number] NHRIC: [id number]   Right upper trapezius total 70 units total in 3 locations Right Scalenes 35 units in 2 locations (total 70) Right SCM 20 Left Levator Scapulae 40

## 2015-02-05 ENCOUNTER — Telehealth: Payer: Self-pay | Admitting: Neurology

## 2015-02-05 NOTE — Telephone Encounter (Signed)
Spoke to him, he is a little sore. ROM intact. No swelling, no other reactions, no skin erythema. Told him this is common. If it worsens call to come see me

## 2015-02-05 NOTE — Telephone Encounter (Signed)
Patient's wife is calling regarding the patient. The patient had Botox this past Tuesday and the vein in his neck is real sore and it is hard for him to turn his head. Please call and discuss.

## 2015-02-15 ENCOUNTER — Telehealth: Payer: Self-pay | Admitting: Neurology

## 2015-02-15 NOTE — Telephone Encounter (Signed)
Spoke with patient, he is still having some tenderness and pain on the right side of the neck. Botox injections initially helped but now have not. Due to asymmetry of musculature on the right to feel that CT of the neck soft tissues is warranted at this time.

## 2015-02-15 NOTE — Telephone Encounter (Signed)
Spoke with patient. He is having right sided neck pain in the soft tissues still. Feel a CT of the neck soft tissues is warrrenred.

## 2015-02-17 ENCOUNTER — Other Ambulatory Visit: Payer: Self-pay | Admitting: Neurology

## 2015-02-17 DIAGNOSIS — M542 Cervicalgia: Secondary | ICD-10-CM

## 2015-02-17 DIAGNOSIS — R221 Localized swelling, mass and lump, neck: Secondary | ICD-10-CM

## 2015-02-17 DIAGNOSIS — M6289 Other specified disorders of muscle: Secondary | ICD-10-CM

## 2015-02-19 ENCOUNTER — Telehealth: Payer: Self-pay | Admitting: Neurology

## 2015-02-19 NOTE — Telephone Encounter (Signed)
Patient is having left sided muscular neck pain, the right side of his neck is not feeling well. He was rubbing the left side of his neck and complaining of pain. He was confused yesterday. 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 Wife stopped by and he seemed better.  [facility] can't get him in for his MRI for over a week. Seth Bake, does [facility] have anything sooner?  Thanks

## 2015-02-20 ENCOUNTER — Other Ambulatory Visit: Payer: Self-pay | Admitting: Neurology

## 2015-02-20 DIAGNOSIS — R220 Localized swelling, mass and lump, head: Secondary | ICD-10-CM

## 2015-02-20 DIAGNOSIS — M542 Cervicalgia: Secondary | ICD-10-CM

## 2015-02-20 DIAGNOSIS — R221 Localized swelling, mass and lump, neck: Secondary | ICD-10-CM

## 2015-02-20 NOTE — Telephone Encounter (Signed)
Changed to mri soft tissue of the neck w/wo contrast thanks per shannon isom

## 2015-02-20 NOTE — Telephone Encounter (Signed)
Patient is scheduled for today 02/20/15 @ 11:30check in @ [facility]. Patient's wife is aware of appointment. Thanks!

## 2015-03-03 ENCOUNTER — Other Ambulatory Visit: Payer: No Typology Code available for payment source

## 2015-04-08 ENCOUNTER — Ambulatory Visit (INDEPENDENT_AMBULATORY_CARE_PROVIDER_SITE_OTHER): Payer: Medicare Other | Admitting: Neurology

## 2015-04-08 VITALS — BP 153/87 | HR 78 | Temp 97.0°F

## 2015-04-08 DIAGNOSIS — G243 Spasmodic torticollis: Secondary | ICD-10-CM

## 2015-04-08 DIAGNOSIS — M542 Cervicalgia: Secondary | ICD-10-CM

## 2015-04-08 NOTE — Progress Notes (Signed)
Botox procedure note  Initial injections today. Refractory to oral medications. Patient did not receive adequate relief from onabotulinum toxin. Will change to Dysport which may be more effective in this particular situation.  reviewed w/ pt the procedure of botulinum toxin, incl side effects - localized weakness, inj site rxn, myalgia and spread from site of injection   EMG: EMG guidance was used to inject muscles detailed below. Aseptic procedure was performed and patient tolerated procedure. Procedure was performed by Dr. Myrla Halsted   Motrin / tylenol for injections site pain / soreness   REMS precautions handout given to patient   RTC - see instructions for details    Dysport-500unitsx1 vials Lot: [id number] Expiration: 05/03/2015 NDC: [id number]  0.9% Sodium Chloride- 33mL total [id number] Expiration: 11/2016 NDC: [id number]   Right upper trap 100units Right levator scapulae 250 in 3 locations proximally Left levator scapulae 50 units distally Left levator scapulae 100 units proximally

## 2015-04-13 ENCOUNTER — Encounter: Payer: Self-pay | Admitting: Neurology

## 2015-04-21 ENCOUNTER — Ambulatory Visit: Payer: Medicare Other | Attending: Neurology | Admitting: Rehabilitative and Restorative Service Providers"

## 2015-04-21 DIAGNOSIS — M542 Cervicalgia: Secondary | ICD-10-CM | POA: Diagnosis present

## 2015-04-21 DIAGNOSIS — R293 Abnormal posture: Secondary | ICD-10-CM | POA: Diagnosis present

## 2015-04-21 NOTE — Patient Instructions (Signed)
Lateral Flexion    With head in comfortable, centered position and chin slightly tucked, gently bring right ear toward right shoulder. Hold _10___ seconds. Repeat with left side. Repeat 5 times. Do _1-2___ sessions per day. *Do in front of a mirror to ensure your shoulders stay level*  http://gt2.exer.us/8   Copyright  VHI. All rights reserved.  Levator Scapula Stretch, Sitting    Sit, one hand tucked under hip on side to be stretched, other hand over top of head. Turn head toward other side and look down. Use hand on head to gently stretch neck in that position. Hold _10__ seconds.  *May not need to pull your head towards shoulder for now. Repeat _3__ times per session. Do _1-2__ sessions per day.  Copyright  VHI. All rights reserved.   Healthy Back - Shoulder Roll    Stand straight with arms relaxed at sides. Roll shoulders backward continuously. Do _10___ times.   Copyright  VHI. All rights reserved.

## 2015-04-21 NOTE — Therapy (Signed)
[facility] [address] [place], [place], [address] Phone: [phone]   Fax:  [phone]  Physical Therapy Evaluation  Patient Details  Name: Scott Ibarra MRN: [id number] Date of Birth: 07-28-1950 Referring Provider: Heide Spark, MD  Encounter Date: 04/21/2015      PT End of Session - 04/21/15 1050    Visit Number 1   Number of Visits 8   Date for PT Re-Evaluation 05/21/15   Authorization Type G code every 10th visit   PT Start Time 0934   PT Stop Time 1015   PT Time Calculation (min) 41 min   Activity Tolerance Patient tolerated treatment well   Behavior During Therapy [facility] for tasks assessed/performed      Past Medical History  Diagnosis Date  . Varicose veins   . Psoriasis     Past Surgical History  Procedure Laterality Date  . Cholecystectomy  2011  . Ligation and stripping left  1994  . Varicose vein surgery  2013    There were no vitals filed for this visit.       Subjective Assessment - 04/21/15 0934    Subjective The patient reports onset of right spasmodic torticollis in 2005.  He reports constant headache since onset.  He tried physical therapy in 2007.  He felt that he was taking heavy medications without much benefit.  He currently uses pain medication only as needed.     Pertinent History recent botox injection R side   Patient Stated Goals Reduce HAs and neck discomfort.   Currently in Pain? Yes   Pain Score 10-Worst pain ever  currently describes as mild discomfort, can go up to 10/10 pain   Pain Location Shoulder   Pain Orientation Right   Pain Descriptors / Indicators Discomfort   Pain Type Chronic pain   Pain Onset More than a month ago   Pain Frequency Constant  varies in intensity   Aggravating Factors  intermittent changes   Pain Relieving Factors botox helps temporarily; heating            [facility] PT Assessment - 04/21/15 0939    Assessment   Medical Diagnosis  neck pain   Referring Provider Heide Spark, MD   Onset Date/Surgical Date --  2005   Hand Dominance Right   Prior Therapy in 2007   Balance Screen   Has the patient fallen in the past 6 months No   Has the patient had a decrease in activity level because of a fear of falling?  No   Is the patient reluctant to leave their home because of a fear of falling?  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   ROM / Strength   AROM / PROM / Strength AROM;Strength   AROM   Overall AROM  Deficits   AROM Assessment Site Cervical   Cervical Flexion WFLs   Cervical Extension WFLs   Cervical - Right Side Bend Limited to 24 degrees with cues to not lean  to the right or elevate L shoulder to accomplish motion.   Cervical - Left Side Bend Limited with tightness reported in R musculature   Cervical - Right Rotation Accomplished full ROm at slower pace and does not demonstrate C3-C4 rotation (accomplishes at upper cervical spine)   Cervical - Left Rotation WFLs   Strength   Overall Strength Within functional limits for tasks performed   Overall Strength Comments Patient is 5/5 for bilateral shoulder flexion, abduction, elbow flexion, elbow extension, wrist flexion/extension.  Grip strength 100 lbs right and 105 lbs left.   Flexibility   Soft Tissue Assessment /Muscle Length --   Palpation   Palpation comment Point tender to right upper trapezius, levator insertion, scalenes.              [facility] Adult PT Treatment/Exercise - 04/21/15 0939     Exercises   Exercises Neck   Neck Exercises: Standing   Other Standing Exercises In front of mirror for visual cues, performed lateral cervical flexion and shoulder rolls.   Neck Exercises: Seated   Other Seated Exercise Seated levator stretch bilaterally.           PT Education - 04/21/15 1006    Education provided Yes   Education Details HEP: shoulder roll, neck lateral flexion, levator sapula   Person(s) Educated Patient   Methods Explanation;Demonstration;Handout   Comprehension Verbalized understanding;Returned demonstration          PT Short Term Goals - 04/21/15 1047    PT SHORT TERM GOAL #1   Title STGs=LTGs           PT Long Term Goals - 04/21/15 1047    PT LONG TERM GOAL #1   Title The patient will return demo HEP for neck Flexibility, strengthening and posture re-education.   Baseline Target date: 05/21/2015   Time 4   Period Weeks   PT LONG TERM GOAL #2   Title The patient will reduce NDI by 10% (baseline20%).   Baseline Target date: 05/21/2015   Time 4   Period Weeks   PT LONG TERM GOAL #3   Title The patient will improve neck lateral flexion to 28 degrees right side (from 24degrees).   Baseline Target date: 05/21/2015   Time 4   Period Weeks   PT LONG TERM GOAL #4   Title The patient will report worse neck pain/headache to be < 6/10 in intensity demonstrating improved mgmt of symptoms.   Baseline Target date: 05/21/2015   Time 4   Period Weeks           Plan - 04/21/15 1052    Clinical Impression Statement The patient is a 65 yo male with 12 year h/o neck pain and tightness.  He reports performing exercises that focus on shoulders at home.  PT initiated HEP today with emphasis on lengthening through soft tissue musculature, improving joint mbility, and then increasing postural stability to reduce pain to more tolerable level.  Patient appears motivated to participate.   Rehab Potential Good   PT Frequency 2x / week   PT Duration 4 weeks    PT Treatment/Interventions ADLs/Self Care Home Management;Neuromuscular re-education;Manual techniques;Passive range of motion;Moist Heat;Cryotherapy;Therapeutic activities;Therapeutic exercise;Patient/family education   PT Next Visit Plan Check initial HEP, initiate scapular retraction exercises; joint moiblity C3-C4 *review imaging* into right rotation; postural stabilization   Consulted and Agree with Plan of Care Patient      Patient will benefit from skilled therapeutic intervention in order to improve the following  deficits and impairments:  Postural dysfunction, Pain, Hypomobility, Impaired flexibility, Decreased range of motion  Visit Diagnosis: Cervicalgia  Abnormal posture      G-Codes - 04/27/15 1047    Functional Assessment Tool Used NDI=20%   Functional Limitation Self care   Self Care Current Status [id number]) At least 20 percent but less than 40 percent impaired, limited or restricted   Self Care Goal Status [id number]) At least 1 percent but less than 20 percent impaired, limited or restricted       Problem List Patient Active Problem List   Diagnosis Date Noted  . 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 Varicose veins of lower extremities with other complications [id number]    Kairy Folsom, PT 04-27-2015, 10:55 AM  [facility] [address] [place], [place], [address] Phone: [phone]   Fax:  [phone]  Name: Scott Ibarra MRN: [id number] Date of Birth: 06-07-1950

## 2015-04-23 ENCOUNTER — Telehealth: Payer: Self-pay | Admitting: Neurology

## 2015-04-23 NOTE — Telephone Encounter (Signed)
Called patient to inform him that his 05/03 apt was scheduled too early and he needed to wait until July to come in for his next injection. He stated that I could go ahead and cancel the May apt but he requested to call me back tomorrow regarding his r/s.

## 2015-04-28 ENCOUNTER — Ambulatory Visit: Payer: Medicare Other | Admitting: Rehabilitative and Restorative Service Providers"

## 2015-04-28 DIAGNOSIS — R293 Abnormal posture: Secondary | ICD-10-CM

## 2015-04-28 DIAGNOSIS — M542 Cervicalgia: Secondary | ICD-10-CM | POA: Diagnosis not present

## 2015-04-28 NOTE — Therapy (Signed)
[facility] [address] [place], [place], [address] Phone: [phone]   Fax:  [phone]  Physical Therapy Treatment  Patient Details  Name: Scott Ibarra MRN: [id number] Date of Birth: [birth year] Referring Provider: Heide Spark, MD  Encounter Date: 04/28/2015      PT End of Session - 04/28/15 1208    Visit Number 2   Number of Visits 8   Date for PT Re-Evaluation 05/21/15   Authorization Type G code every 10th visit   PT Start Time 1103   PT Stop Time 1152   PT Time Calculation (min) 49 min   Activity Tolerance Patient tolerated treatment well   Behavior During Therapy [facility] for tasks assessed/performed      Past Medical History  Diagnosis Date  . Varicose veins   . Psoriasis     Past Surgical History  Procedure Laterality Date  . Cholecystectomy  2011  . Ligation and stripping left  1994  . Varicose vein surgery  2013    There were no vitals filed for this visit.      Subjective Assessment - 04/28/15 1121    Subjective The patient reports feeling more motion in neck with exercises.  He notes popping and cracking, but does not associate greater pain with those feelings.   Patient Stated Goals Reduce HAs and neck discomfort.   Currently in Pain? Yes   Pain Score --  Uncomfortable today, not pain   Pain Location Shoulder   Pain Orientation Right   Pain Descriptors / Indicators Discomfort   Pain Type Chronic pain   Pain Onset More than a month ago   Pain Frequency Constant   Aggravating Factors  unsure   Pain Relieving Factors unsure               [facility] Adult PT Treatment/Exercise - 04/28/15 1120    Exercises   Exercises Neck;Shoulder   Neck Exercises: Supine   Cervical Isometrics Right lateral flexion;Left lateral flexion;Right rotation;Left rotation;5 reps   Neck Retraction 10 reps  with manual resistance   Shoulder Exercises: Seated   External Rotation Strengthening;Both;10  reps  with elbows at neutral with gren theraband.   Other Seated Exercises The patient performed seated scaption x 10 reps each with green theraband.   Shoulder Exercises: Prone   Retraction Right;Left;10 reps   Retraction Limitations Unable to lift without pain with shoulders flexed to overhead position, therefore put UEs at neutral beside hips and had patient lift shoulders as "reaching to your shoes"   Shoulder Exercises: Stretch   Other Shoulder Stretches towel roll stretch supine x 2 minutes   Manual Therapy   Manual Therapy Soft tissue mobilization;Myofascial release;Manual Traction   Manual therapy comments C3-C4 point tender on Left side, right levator and distal upper trap soreness   Soft tissue mobilization gentle STM scalenes, upper trap, levator, right parascapular musculature.   Manual Traction gentle supine manual traction within tolerable range for muscle relaxation and to reduce muscle guarding                PT Education - 04/28/15 1154    Education provided Yes   Education Details HEP: scaption (band) seated, ER scap retraction, towel roll stretch for thoracic self mobilization   Person(s) Educated Patient   Methods Explanation;Demonstration;Handout   Comprehension Verbalized understanding;Returned demonstration          PT Short Term Goals - 04/21/15 1047    PT SHORT TERM GOAL #1  Title STGs=LTGs           PT Long Term Goals - 04/21/15 1047    PT LONG TERM GOAL #1   Title The patient will return demo HEP for neck Flexibility, strengthening and posture re-education.   Baseline Target date: 05/21/2015   Time 4   Period Weeks   PT LONG TERM GOAL #2   Title The patient will reduce NDI by 10% (baseline20%).   Baseline Target date: 05/21/2015   Time 4   Period Weeks   PT LONG TERM GOAL #3   Title The patient will improve neck lateral flexion to 28 degrees right side (from 24degrees).   Baseline Target date: 05/21/2015   Time 4   Period Weeks   PT  LONG TERM GOAL #4   Title The patient will report worse neck pain/headache to be < 6/10 in intensity demonstrating improved mgmt of symptoms.   Baseline Target date: 05/21/2015   Time 4   Period Weeks               Plan - 04/28/15 1225    Clinical Impression Statement The patient tolerates HEP progression well.  He notes "discomfort" with activity, but no pain during session (except for point tender C3-C4 left side).  Continue to LTGs.   PT Treatment/Interventions ADLs/Self Care Home Management;Neuromuscular re-education;Manual techniques;Passive range of motion;Moist Heat;Cryotherapy;Therapeutic activities;Therapeutic exercise;Patient/family education   PT Next Visit Plan check HEP, postural stabilization, doorway stretches for work/standing mgmt of symptoms   Consulted and Agree with Plan of Care Patient      Patient will benefit from skilled therapeutic intervention in order to improve the following deficits and impairments:  Postural dysfunction, Pain, Hypomobility, Impaired flexibility, Decreased range of motion  Visit Diagnosis: Cervicalgia  Abnormal posture     Problem List Patient Active Problem List   Diagnosis Date Noted  . 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 Varicose veins of lower extremities with other complications [id number]    Kris No, PT 04/28/2015, 12:27 PM  [facility] [address] [place], [place], [address] Phone: [phone]   Fax:  [phone]  Name: Scott Ibarra MRN: [id number] Date of Birth: 01-30-1950

## 2015-04-28 NOTE — Patient Instructions (Signed)
SHOULDER: Scaption (Band)    Place arm at 45 angle to body. Holding band, raise arm slightly above shoulder, keeping elbow straight. Hold _3__ seconds. Use ___green_____ band. _10__ reps per set, _2__ sets per day. THUMB LEADS  Copyright  VHI. All rights reserved.  Shoulder External Rotation    Sit with arm across torso holding tubing, with towel between elbow and ribs. Rotate arm away from midline. Keep elbow bent at right angle. Anchor to side, mid-chest. Do _1__ sets of __10-15_ repetitions.  CAN DO BOTH AT THE SAME TIME WITH GREEN BAND.  Copyright  VHI. All rights reserved.  Thoracic Self-Mobilization (Supine)    With rolled towel placed lengthwise at lower ribs level, lie back on towel with arms outstretched. Hold __2 MINUTES. Relax. Repeat __1__ times per set. Do __1-2__ sessions per day.  http://orth.exer.us/1000   Copyright  VHI. All rights reserved.

## 2015-05-01 ENCOUNTER — Ambulatory Visit: Payer: No Typology Code available for payment source

## 2015-05-04 ENCOUNTER — Ambulatory Visit: Payer: No Typology Code available for payment source | Admitting: Rehabilitative and Restorative Service Providers"

## 2015-05-05 ENCOUNTER — Ambulatory Visit: Payer: No Typology Code available for payment source | Admitting: Rehabilitative and Restorative Service Providers"

## 2015-05-06 ENCOUNTER — Ambulatory Visit: Payer: Medicare Other | Admitting: Neurology

## 2015-05-08 ENCOUNTER — Ambulatory Visit: Payer: Medicare Other | Attending: Neurology | Admitting: Rehabilitative and Restorative Service Providers"

## 2015-05-08 DIAGNOSIS — M542 Cervicalgia: Secondary | ICD-10-CM | POA: Diagnosis present

## 2015-05-08 DIAGNOSIS — R293 Abnormal posture: Secondary | ICD-10-CM | POA: Insufficient documentation

## 2015-05-08 NOTE — Therapy (Signed)
[facility] [address] [place], [place], [address] Phone: [phone]   Fax:  [phone]  Physical Therapy Treatment  Patient Details  Name: Scott Ibarra MRN: [id number] Date of Birth: May 22, 1950 Referring Provider: Heide Spark, MD  Encounter Date: 05/08/2015      PT End of Session - 05/08/15 1221    Visit Number 3   Number of Visits 8   Date for PT Re-Evaluation 05/21/15   Authorization Type G code every 10th visit   PT Start Time 1017   PT Stop Time 1100   PT Time Calculation (min) 43 min   Activity Tolerance Patient tolerated treatment well   Behavior During Therapy [facility] for tasks assessed/performed      Past Medical History  Diagnosis Date  . Varicose veins   . Psoriasis     Past Surgical History  Procedure Laterality Date  . Cholecystectomy  2011  . Ligation and stripping left  1994  . Varicose vein surgery  2013    There were no vitals filed for this visit.      Subjective Assessment - 05/08/15 1017    Subjective The patient reports that he notes right sided swelling better today.     Pertinent History recent botox injection R side   Patient Stated Goals Reduce HAs and neck discomfort.   Currently in Pain? Yes   Pain Score 2    Pain Location Neck   Pain Orientation Right   Pain Descriptors / Indicators Discomfort   Pain Type Chronic pain   Pain Onset More than a month ago   Pain Frequency Constant   Aggravating Factors  unsure   Pain Relieving Factors unsure                         [facility] Adult PT Treatment/Exercise - 05/08/15 1018    Exercises   Exercises Neck;Shoulder;Other Exercises   Other Exercises  Patient has R shoulder crepitus with quadriped UE extension when weight bearing through the right.    Neck Exercises: Prone   Other Prone Exercise Prone on elbows with scapular protraction/retraction.     Other Prone Exercise Attempted quadriped exercises with  right shoulder crepitus and discomfort.   Shoulder Exercises: Seated   Other Seated Exercises Seated physioball exercises for postural stabilization including marching x 5 reps each leg, seated scapular retraction x 5 reps x 2 sets with tactile cues for positioning.    Shoulder Exercises: Standing   Retraction Both;10 reps   Theraband Level (Shoulder Retraction) Level 2 (Red)   Retraction Limitations performed at neutral and then with shoulders abducted at 90 degrees   Other Standing Exercises wall push ups x 10 reps with hands narrow and then wider   Other Standing Exercises door frame stretch for neural gliding in R and L UE x 3 reps   Manual Therapy   Manual Therapy Soft tissue mobilization   Soft tissue mobilization gentle STM scalenes, upper trap, levator, right parascapular musculature.   Manual Traction gentle distraction for muscle relaxation                  PT Short Term Goals - 04/21/15 1047    PT SHORT TERM GOAL #1   Title STGs=LTGs           PT Long Term Goals - 04/21/15 1047    PT LONG TERM GOAL #1   Title The patient will return demo HEP for  neck Flexibility, strengthening and posture re-education.   Baseline Target date: 05/21/2015   Time 4   Period Weeks   PT LONG TERM GOAL #2   Title The patient will reduce NDI by 10% (baseline20%).   Baseline Target date: 05/21/2015   Time 4   Period Weeks   PT LONG TERM GOAL #3   Title The patient will improve neck lateral flexion to 28 degrees right side (from 24degrees).   Baseline Target date: 05/21/2015   Time 4   Period Weeks   PT LONG TERM GOAL #4   Title The patient will report worse neck pain/headache to be < 6/10 in intensity demonstrating improved mgmt of symptoms.   Baseline Target date: 05/21/2015   Time 4   Period Weeks               Plan - 05/08/15 1222    Clinical Impression Statement The patient is noting ability to perform HEP and decreased prominence of the R cervical musculature.   With soft tissue mobilization today, he was point tender over bilateral upper trap musculature and reduces with soft tissue mobilization.    PT Treatment/Interventions ADLs/Self Care Home Management;Neuromuscular re-education;Manual techniques;Passive range of motion;Moist Heat;Cryotherapy;Therapeutic activities;Therapeutic exercise;Patient/family education   PT Next Visit Plan check HEP, postural stabilization, doorway stretches for work/standing mgmt of symptoms   Consulted and Agree with Plan of Care Patient      Patient will benefit from skilled therapeutic intervention in order to improve the following deficits and impairments:  Postural dysfunction, Pain, Hypomobility, Impaired flexibility, Decreased range of motion  Visit Diagnosis: Cervicalgia  Abnormal posture     Problem List Patient Active Problem List   Diagnosis Date Noted  . 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 Varicose veins of lower extremities with other complications [id number]    Lauraine Crespo, PT 05/08/2015, 12:26 PM  [facility] [address] [place], [place], [address] Phone: [phone]   Fax:  [phone]  Name: Scott Ibarra MRN: [id number] Date of Birth: 05/19/1950

## 2015-05-11 ENCOUNTER — Ambulatory Visit: Payer: Medicare Other | Admitting: Rehabilitative and Restorative Service Providers"

## 2015-05-11 DIAGNOSIS — M542 Cervicalgia: Secondary | ICD-10-CM

## 2015-05-11 DIAGNOSIS — R293 Abnormal posture: Secondary | ICD-10-CM

## 2015-05-11 NOTE — Therapy (Signed)
[facility] [address] [place], [place], [address] Phone: [phone]   Fax:  [phone]  Physical Therapy Treatment  Patient Details  Name: Scott Ibarra MRN: [id number] Date of Birth: 1950/04/06 Referring Provider: Heide Spark, MD  Encounter Date: 05/11/2015      PT End of Session - 05/11/15 1010    Visit Number 4   Number of Visits 8   Date for PT Re-Evaluation 05/21/15   Authorization Type G code every 10th visit   PT Start Time 0934   PT Stop Time 1014   PT Time Calculation (min) 40 min   Activity Tolerance Patient tolerated treatment well   Behavior During Therapy [facility] for tasks assessed/performed      Past Medical History  Diagnosis Date  . Varicose veins   . Psoriasis     Past Surgical History  Procedure Laterality Date  . Cholecystectomy  2011  . Ligation and stripping left  1994  . Varicose vein surgery  2013    There were no vitals filed for this visit.      Subjective Assessment - 05/11/15 0940    Subjective The patient reports he has baseline discomfort all of the time.  He is exercising at work and home to manage symtpoms.   Patient Stated Goals Reduce HAs and neck discomfort.   Currently in Pain? Yes   Pain Score --  discomfort   Pain Location Neck   Pain Orientation Right   Pain Descriptors / Indicators Discomfort   Pain Type Chronic pain   Pain Onset More than a month ago   Pain Frequency Constant   Aggravating Factors  unsure   Pain Relieving Factors unsure                 [facility] Adult PT Treatment/Exercise - 05/11/15 0954    Exercises   Exercises Neck;Shoulder   Neck Exercises: Machines for Strengthening   UBE (Upper Arm Bike) 2 minutes forward/2 minutes backward at resistance level 4.0   Shoulder Exercises: Supine   Protraction 10 reps  right and left sides without weights.   Flexion AROM;5 reps;Both  Overhead reaching with mild dec'd ROM right side   Shoulder Exercises: Seated   Retraction 10 reps  on physioball   Theraband Level (Shoulder Retraction) Level 4 (Blue)   Retraction Limitations with tactile cues for scapular mobility into retraction   Other Seated Exercises physioball seated exercises with shoulder circles x 20 reps each circle   Shoulder Exercises: Prone   Other Prone Exercises Plank x 10 seconds x 2 reps with holds.  Prone on elbows reaching right and left sides x 5 reps alternating.     Shoulder Exercises: Stretch   Other Shoulder Stretches supine x 2 minutes over pool noodle with anterior chest stretching   Manual Therapy   Manual Therapy Soft tissue mobilization   Manual therapy comments point tender R upper trap and scalenes, L upper trap   Soft tissue mobilization STM levator, scalenes, upper trapezius musculature                  PT Short Term Goals - 04/21/15 1047    PT SHORT TERM GOAL #1   Title STGs=LTGs           PT Long Term Goals - 05/11/15 1357    PT LONG TERM GOAL #1   Title The patient will return demo HEP for neck Flexibility, strengthening and posture re-education.   Baseline  Target date: 05/21/2015   Time 4   Period Weeks   PT LONG TERM GOAL #2   Title The patient will reduce NDI by 10% (baseline20%).   Baseline Target date: 05/21/2015   Time 4   Period Weeks   PT LONG TERM GOAL #3   Title The patient will improve neck lateral flexion to 28 degrees right side (from 24degrees).   Baseline Target date: 05/21/2015   Time 4   Period Weeks   PT LONG TERM GOAL #4   Title The patient will report neck pain/headache at it's worst to be < 6/10 in intensity demonstrating improved mgmt of symptoms.   Baseline Target date: 05/21/2015   Time 4   Period Weeks               Plan - 05/11/15 1358    Clinical Impression Statement The patient has shoulder crepitus during plank and weight bearing UE activities.  PT performing UE and shoulder/neck stabilization for improved postural  support.  Continue to LTGs.    PT Treatment/Interventions ADLs/Self Care Home Management;Neuromuscular re-education;Manual techniques;Passive range of motion;Moist Heat;Cryotherapy;Therapeutic activities;Therapeutic exercise;Patient/family education   PT Next Visit Plan Add more HEP - consider activities to do at work.  Patient to check into gym for next week.  Write down suggestions for gym routine   Consulted and Agree with Plan of Care Patient      Patient will benefit from skilled therapeutic intervention in order to improve the following deficits and impairments:  Postural dysfunction, Pain, Hypomobility, Impaired flexibility, Decreased range of motion  Visit Diagnosis: Cervicalgia  Abnormal posture     Problem List Patient Active Problem List   Diagnosis Date Noted  . 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 Varicose veins of lower extremities with other complications [id number]    Suriah Peragine, PT 05/11/2015, 2:01 PM  [facility] [address] [place], [place], [address] Phone: [phone]   Fax:  [phone]  Name: Scott Ibarra MRN: [id number] Date of Birth: 12-12-1950

## 2015-05-13 ENCOUNTER — Ambulatory Visit: Payer: No Typology Code available for payment source | Admitting: Rehabilitative and Restorative Service Providers"

## 2015-05-28 ENCOUNTER — Ambulatory Visit: Payer: No Typology Code available for payment source | Admitting: Rehabilitative and Restorative Service Providers"

## 2015-07-09 ENCOUNTER — Encounter: Payer: Self-pay | Admitting: Rehabilitative and Restorative Service Providers"

## 2015-07-09 NOTE — Therapy (Signed)
[facility] [address] [place], [place], [address] Phone: [phone]   Fax:  [phone]  Patient Details  Name: Scott Ibarra MRN: [id number] Date of Birth: 04/26/1950 Referring Provider:  No ref. provider found  Encounter Date:  05/11/2015 last encounter  PHYSICAL THERAPY DISCHARGE SUMMARY  Visits from Start of Care: 4  Current functional level related to goals / functional outcomes:     PT Short Term Goals - 04/21/15 1047    PT SHORT TERM GOAL #1   Title STGs=LTGs         PT Long Term Goals - 05/11/15 1357    PT LONG TERM GOAL #1   Title The patient will return demo HEP for neck Flexibility, strengthening and posture re-education.   Baseline Target date: 05/21/2015   Time 4   Period Weeks   PT LONG TERM GOAL #2   Title The patient will reduce NDI by 10% (baseline20%).   Baseline Target date: 05/21/2015   Time 4   Period Weeks   PT LONG TERM GOAL #3   Title The patient will improve neck lateral flexion to 28 degrees right side (from 24degrees).   Baseline Target date: 05/21/2015   Time 4   Period Weeks   PT LONG TERM GOAL #4   Title The patient will report neck pain/headache at it's worst to be < 6/10 in intensity demonstrating improved mgmt of symptoms.   Baseline Target date: 05/21/2015   Time 4   Period Weeks     *patient cancelled remaining visit due to work schedule   Remaining deficits: See initial eval.   Education / Equipment: HEP  Plan: Patient agrees to discharge.  Patient goals were not met. Patient is being discharged due to not returning since the last visit.  ?????       Thank you for the referral of this patient. Rudell Cobb, MPT    Elliyah Liszewski 07/09/2015, 2:27 PM  [facility] [address] [place], [place], [address] Phone: [phone]   Fax:  [phone]

## 2015-08-07 ENCOUNTER — Telehealth: Payer: Self-pay | Admitting: Neurology

## 2015-08-07 NOTE — Telephone Encounter (Signed)
Pt's wife called in concerned that her husband is having increased memory loss. He is unable to tell you what any conversations were about. He could not remember how to get home and they have been living in the same place for over 20 years. He is irritables more frequently, she states his mood/personality changes quickly. Concentration is off. Pt knows something is wrong/ he is not him self.   [id number] cell

## 2015-08-07 NOTE — Telephone Encounter (Signed)
I'm happy to see them emma. thanks

## 2015-08-07 NOTE — Telephone Encounter (Signed)
Dr Jaynee Eagles- FYI Called wife back. Made f/u on 08/11/15 at 10am, check in 945am. Advised them to proceed to ED if sx worsen or he has any new sx. She verbalized understanding.  Pt denies urinary urgency, burning pain, frequency.

## 2015-08-07 NOTE — Telephone Encounter (Signed)
Dr Jaynee Eagles- FYI Called and spoke to Scott Ibarra. Advised this is a new problem that Dr Jaynee Eagles has not evaluated pt for. He should see PCP first. She stated he does not have PCP. I advised they go to walk in clinic or urgent care for him to be evaluated. If they feel he should be evaluated by neurology, we are happy to schedule an appt. She verbalized understanding. She will call back if they have any more questions.

## 2015-08-11 ENCOUNTER — Ambulatory Visit (INDEPENDENT_AMBULATORY_CARE_PROVIDER_SITE_OTHER): Payer: Medicare Other | Admitting: Neurology

## 2015-08-11 ENCOUNTER — Encounter: Payer: Self-pay | Admitting: Neurology

## 2015-08-11 VITALS — BP 141/83 | HR 81 | Ht 74.0 in | Wt 251.4 lb

## 2015-08-11 DIAGNOSIS — R400 Somnolence: Secondary | ICD-10-CM

## 2015-08-11 DIAGNOSIS — R413 Other amnesia: Secondary | ICD-10-CM | POA: Diagnosis not present

## 2015-08-11 DIAGNOSIS — G4710 Hypersomnia, unspecified: Secondary | ICD-10-CM

## 2015-08-11 DIAGNOSIS — R0683 Snoring: Secondary | ICD-10-CM | POA: Diagnosis not present

## 2015-08-11 DIAGNOSIS — F05 Delirium due to known physiological condition: Secondary | ICD-10-CM | POA: Diagnosis not present

## 2015-08-11 DIAGNOSIS — Z818 Family history of other mental and behavioral disorders: Secondary | ICD-10-CM | POA: Diagnosis not present

## 2015-08-11 DIAGNOSIS — E538 Deficiency of other specified B group vitamins: Secondary | ICD-10-CM

## 2015-08-11 DIAGNOSIS — E559 Vitamin D deficiency, unspecified: Secondary | ICD-10-CM | POA: Diagnosis not present

## 2015-08-11 DIAGNOSIS — R410 Disorientation, unspecified: Secondary | ICD-10-CM

## 2015-08-11 DIAGNOSIS — R5383 Other fatigue: Secondary | ICD-10-CM | POA: Diagnosis not present

## 2015-08-11 MED ORDER — DONEPEZIL HCL 10 MG PO TABS: 10.0000 mg | ORAL_TABLET | Freq: Every day | ORAL | 12 refills | Status: DC

## 2015-08-11 NOTE — Patient Instructions (Addendum)
Remember to drink plenty of fluid, eat healthy meals and do not skip any meals. Try to eat protein with a every meal and eat a healthy snack such as fruit or nuts in between meals. Try to keep a regular sleep-wake schedule and try to exercise daily, particularly in the form of walking, 20-30 minutes a day, if you can.   As far as your medications are concerned, I would like to suggest: Aricept. Start woth 1/2 tablet then increase to a whole tablet  As far as diagnostic testing: MRI of the brain, Labs, Neurocognitive testing  I would like to see you back in 3 months, sooner if we need to. Please call us with any interim questions, concerns, problems, updates or refill requests.   Our phone number is [phone]. We also have an after hours call service for urgent matters and there is a physician on-call for urgent questions. For any emergencies you know to call 911 or go to the nearest emergency room  Donepezil tablets What is this medicine? DONEPEZIL (doe NEP e zil) is used to treat mild to moderate dementia caused by Alzheimer's disease. This medicine may be used for other purposes; ask your health care provider or pharmacist if you have questions. What should I tell my health care provider before I take this medicine? They need to know if you have any of these conditions: -asthma or other lung disease -difficulty passing urine -head injury -heart disease -history of irregular heartbeat -liver disease -seizures (convulsions) -stomach or intestinal disease, ulcers or stomach bleeding -an unusual or allergic reaction to donepezil, other medicines, foods, dyes, or preservatives -pregnant or trying to get pregnant -breast-feeding How should I use this medicine? Take this medicine by mouth with a glass of water. Follow the directions on the prescription label. You may take this medicine with or without food. Take this medicine at regular intervals. This medicine is usually taken before  bedtime. Do not take it more often than directed. Continue to take your medicine even if you feel better. Do not stop taking except on your doctor's advice. If you are taking the 23 mg donepezil tablet, swallow it whole; do not cut, crush, or chew it. Talk to your pediatrician regarding the use of this medicine in children. Special care may be needed. Overdosage: If you think you have taken too much of this medicine contact a poison control center or emergency room at once. NOTE: This medicine is only for you. Do not share this medicine with others. What if I miss a dose? If you miss a dose, take it as soon as you can. If it is almost time for your next dose, take only that dose, do not take double or extra doses. What may interact with this medicine? Do not take this medicine with any of the following medications: -certain medicines for fungal infections like itraconazole, fluconazole, posaconazole, and voriconazole -cisapride -dextromethorphan; quinidine -dofetilide -dronedarone -pimozide -quinidine -thioridazine -ziprasidone This medicine may also interact with the following medications: -antihistamines for allergy, cough and cold -atropine -bethanechol -carbamazepine -certain medicines for bladder problems like oxybutynin, tolterodine -certain medicines for Parkinson's disease like benztropine, trihexyphenidyl -certain medicines for stomach problems like dicyclomine, hyoscyamine -certain medicines for travel sickness like scopolamine -dexamethasone -ipratropium -NSAIDs, medicines for pain and inflammation, like ibuprofen or naproxen -other medicines for Alzheimer's disease -other medicines that prolong the QT interval (cause an abnormal heart rhythm) -phenobarbital -phenytoin -rifampin, rifabutin or rifapentine This list may not describe all possible interactions. Give your health  care provider a list of all the medicines, herbs, non-prescription drugs, or dietary supplements  you use. Also tell them if you smoke, drink alcohol, or use illegal drugs. Some items may interact with your medicine. What should I watch for while using this medicine? Visit your doctor or health care professional for regular checks on your progress. Check with your doctor or health care professional if your symptoms do not get better or if they get worse. You may get drowsy or dizzy. Do not drive, use machinery, or do anything that needs mental alertness until you know how this drug affects you. What side effects may I notice from receiving this medicine? Side effects that you should report to your doctor or health care professional as soon as possible: -allergic reactions like skin rash, itching or hives, swelling of the face, lips, or tongue -changes in vision -feeling faint or lightheaded, falls -problems with balance -redness, blistering, peeling or loosening of the skin, including inside the mouth -slow heartbeat, or palpitations -stomach pain -unusual bleeding or bruising, red or purple spots on the skin -vomiting -weight loss Side effects that usually do not require medical attention (report to your doctor or health care professional if they continue or are bothersome): -diarrhea, especially when starting treatment -headache -indigestion or heartburn -loss of appetite -muscle cramps -nausea This list may not describe all possible side effects. Call your doctor for medical advice about side effects. You may report side effects to FDA at 1-800-FDA-1088. Where should I keep my medicine? Keep out of reach of children. Store at room temperature between 15 and 30 degrees C (59 and 86 degrees F). Throw away any unused medicine after the expiration date. NOTE: This sheet is a summary. It may not cover all possible information. If you have questions about this medicine, talk to your doctor, pharmacist, or health care provider.    2016, Elsevier/Gold Standard. (2013-08-01 07:51:52)

## 2015-08-11 NOTE — Progress Notes (Addendum)
[facility]    Provider:  Dr Jaynee Eagles Referring Provider: [name]* Primary Care Physician:  [facility]  CC:  Memory changes  HPI:  Scott Ibarra is a 65 y.o. male here as a referral from Dr. Sharmaine Base for memory changes. No significant PMHx.  Memory changes started a year ago and slowly worsening with increase over the last 6 months. Wife and daughter noticed. Patient just admitted. He forgets conversations, he can remember songs from the 29s, more short-term memory changes. He can;t remember how to get to locations he has been going to for 22 years. He couldn;t remember how to get here today. He finds driving confusing. Misplaces objects. Wife has always done the finances. Recently he has more mood swings. Daughetr and wife go into their rooms because they can;t be around him. His computer froze up and he threw his hands up in the air. He got extremely frustrated. No hallucinations, no delusions. No new medications. Has been on flexeril prn for years no new medications. No head trauma. Unknown if work is noticing. Still can use a computer. Irritability. Forgets conversations quickly, blank stares like he never heard it. He had depression 10 years ago when he was having a lot of stress. Denies current mood disorders. leep well, does not wake up refreshed, he snores but not as heavily as he used to. Mother with dementia. Other extended family members on both sides with dementia. Mostly mother's side. She developed dementia in late 88s or early 78s.  Review of Systems: Patient complains of symptoms per HPI as well as the following symptoms: Fatigue, frequency of urination, headache, memory loss, confusion, agitation, decreased concentration, aching muscles, frequent waking. Pertinent negatives per HPI. All others negative.   Social History   Social History  . Marital status: Married    Spouse name: [name]  . Number of  children: N/A  . Years of education: 38   Occupational History  . Catering manager- retired     Social History Main Topics  . Smoking status: Never Smoker  . Smokeless tobacco: Not on file  . Alcohol use No  . Drug use: No  . Sexual activity: Not on file   Other Topics Concern  . Not on file   Social History Narrative   Lives at home with wife.   Caffeine use: Drinks coffee/tea (2-3 cups per day)       Family History  Problem Relation Age of Onset  . Stroke Mother   . Cancer Father     Past Medical History:  Diagnosis Date  . Psoriasis   . Varicose veins     Past Surgical History:  Procedure Laterality Date  . CHOLECYSTECTOMY  2011  . ligation and stripping left  1994  . VARICOSE VEIN SURGERY  2013    Current Outpatient Prescriptions  Medication Sig Dispense Refill  . cyclobenzaprine (FLEXERIL) 5 MG tablet Take 5 mg by mouth 3 (three) times daily as needed for muscle spasms.    . naproxen (NAPROSYN) 500 MG tablet Take 500 mg by mouth 2 (two) times daily with a meal.     No current facility-administered medications for this visit.     Allergies as of 08/11/2015  . (No Known Allergies)    Vitals: BP (!) 141/83 (BP Location: Right Arm, Patient Position: Sitting, Cuff Size: Large)   Pulse 81   Ht 6\' 2"  (1.88 m)   Wt 251 lb 6.4 oz (114 kg)  BMI 32.28 kg/m  Last Weight:  Wt Readings from Last 1 Encounters:  08/11/15 251 lb 6.4 oz (114 kg)   Last Height:   Ht Readings from Last 1 Encounters:  08/11/15 6\' 2"  (1.88 m)   Physical exam: Exam: Gen: NAD, conversant, well nourised, obese, well groomed                     Eyes: Conjunctivae clear without exudates or hemorrhage  Neuro: Detailed Neurologic Exam  Speech:    Speech is normal; fluent and spontaneous with normal comprehension.  Cognition:    The patient is oriented to person, place, and time;    MMSE - Mini Mental State Exam 08/11/2015  Orientation to time 5  Orientation to Place 5   Registration 3  Attention/ Calculation 5  Recall 3  Language- name 2 objects 2  Language- repeat 1  Language- follow 3 step command 3  Language- read & follow direction 1  Write a sentence 1  Copy design 1  Total score 30   Cranial Nerves:    The pupils are equal, round, and reactive to light. . Visual fields are full to finger confrontation. Extraocular movements are intact. Trigeminal sensation is intact and the muscles of mastication are normal. The face is symmetric. The palate elevates in the midline. Hearing intact. Voice is normal. Shoulder shrug is normal. The tongue has normal motion without fasciculations.   Coordination:    Normal finger to nose and heel to shin. Normal rapid alternating movements.   Gait:    Not ataxic  Motor Observation:    No asymmetry, no atrophy, and no involuntary movements noted. Tone:    Normal muscle tone.    Posture:    Posture is normal. normal erect    Strength:    Strength is V/V in the upper and lower limbs.         Assessment/Plan:  65 year old with memory changes a FHx of eraly-onset dementia in his mother  MRi of the brain TSH and B12 and RPR Discussed FDG Pet scan as well as our CREAD research trial.  Neurocognitive testing Aricept - hold off on starting until you decide if you would like to participate in the cread study. Epworth Sleepiness scale 13, snoring, memory loss: sleep evaluation  F/u 3 months   Sarina Ill, MD  [facility] [address] [place], [place] [address]  Phone [phone] Fax [phone]  A total of 30 minutes was spent face-to-face with this patient. Over half this time was spent on counseling patient on the memory loss diagnosis and different diagnostic and therapeutic options available.

## 2015-08-12 ENCOUNTER — Encounter: Payer: Self-pay | Admitting: Neurology

## 2015-08-12 DIAGNOSIS — R413 Other amnesia: Secondary | ICD-10-CM | POA: Insufficient documentation

## 2015-08-12 LAB — THYROID PANEL WITH TSH
FREE THYROXINE INDEX: 2.1 (ref 1.2–4.9)
T3 Uptake Ratio: 31 % (ref 24–39)
T4, Total: 6.7 ug/dL (ref 4.5–12.0)
TSH: 3.9 u[IU]/mL (ref 0.450–4.500)

## 2015-08-12 LAB — VITAMIN D 25 HYDROXY (VIT D DEFICIENCY, FRACTURES): VIT D 25 HYDROXY: 23.3 ng/mL — AB (ref 30.0–100.0)

## 2015-08-12 LAB — BASIC METABOLIC PANEL
BUN / CREAT RATIO: 15 (ref 10–24)
BUN: 15 mg/dL (ref 8–27)
CHLORIDE: 102 mmol/L (ref 96–106)
CO2: 28 mmol/L (ref 18–29)
Calcium: 9.3 mg/dL (ref 8.6–10.2)
Creatinine, Ser: 0.99 mg/dL (ref 0.76–1.27)
GFR calc non Af Amer: 78 mL/min/{1.73_m2} (ref >59)
GFR, EST AFRICAN AMERICAN: 91 mL/min/{1.73_m2} (ref >59)
GLUCOSE: 72 mg/dL (ref 65–99)
Potassium: 4.4 mmol/L (ref 3.5–5.2)
SODIUM: 144 mmol/L (ref 134–144)

## 2015-08-12 LAB — B12 AND FOLATE PANEL
Folate: 9.2 ng/mL (ref >3.0)
Vitamin B-12: 339 pg/mL (ref 211–946)

## 2015-08-12 LAB — RPR: RPR: NONREACTIVE

## 2015-08-12 NOTE — Addendum Note (Signed)
Addended by: Sarina Ill B on: 08/12/2015 10:36 AM   Modules accepted: Orders

## 2015-08-14 ENCOUNTER — Telehealth: Payer: Self-pay | Admitting: *Deleted

## 2015-08-14 NOTE — Telephone Encounter (Signed)
Per Dr Jaynee Eagles, LVM advising patient his labs are good except for low vitamin D. Advised Dr Jaynee Eagles wants him to buy OTC vitamin D and take 1000-2000 units once a day. Repeated instructions and left name, number for any questions. Advised the office is now closed.

## 2015-08-27 ENCOUNTER — Institutional Professional Consult (permissible substitution): Payer: Medicare Other | Admitting: Neurology

## 2015-08-28 ENCOUNTER — Ambulatory Visit (INDEPENDENT_AMBULATORY_CARE_PROVIDER_SITE_OTHER): Payer: Self-pay

## 2015-08-28 DIAGNOSIS — R413 Other amnesia: Secondary | ICD-10-CM | POA: Diagnosis not present

## 2015-08-28 DIAGNOSIS — R410 Disorientation, unspecified: Secondary | ICD-10-CM

## 2015-08-28 DIAGNOSIS — Z818 Family history of other mental and behavioral disorders: Secondary | ICD-10-CM

## 2015-08-28 DIAGNOSIS — F05 Delirium due to known physiological condition: Secondary | ICD-10-CM | POA: Diagnosis not present

## 2015-08-28 DIAGNOSIS — Z0289 Encounter for other administrative examinations: Secondary | ICD-10-CM

## 2015-08-31 ENCOUNTER — Telehealth: Payer: Self-pay | Admitting: *Deleted

## 2015-08-31 NOTE — Telephone Encounter (Signed)
Per Dr Jaynee Eagles, spoke with patient's wife and informed her that his MRI brain result is normal for his age. Advised that Dr Jaynee Eagles will order additional workup following his neurocognitive testing results. She stated he has an appointment for neurocognitive test this Thurs at 9 am.   She verbalized understanding and appreciation of call.

## 2015-09-09 ENCOUNTER — Encounter: Payer: Self-pay | Admitting: Neurology

## 2015-09-09 ENCOUNTER — Ambulatory Visit (INDEPENDENT_AMBULATORY_CARE_PROVIDER_SITE_OTHER): Payer: Medicare Other | Admitting: Neurology

## 2015-09-09 VITALS — BP 150/66 | HR 72 | Resp 18 | Ht 74.0 in | Wt 252.0 lb

## 2015-09-09 DIAGNOSIS — R351 Nocturia: Secondary | ICD-10-CM

## 2015-09-09 DIAGNOSIS — R51 Headache: Secondary | ICD-10-CM

## 2015-09-09 DIAGNOSIS — R0681 Apnea, not elsewhere classified: Secondary | ICD-10-CM | POA: Diagnosis not present

## 2015-09-09 DIAGNOSIS — G4710 Hypersomnia, unspecified: Secondary | ICD-10-CM

## 2015-09-09 DIAGNOSIS — R0683 Snoring: Secondary | ICD-10-CM

## 2015-09-09 DIAGNOSIS — R413 Other amnesia: Secondary | ICD-10-CM | POA: Diagnosis not present

## 2015-09-09 DIAGNOSIS — R519 Headache, unspecified: Secondary | ICD-10-CM

## 2015-09-09 NOTE — Progress Notes (Signed)
Subjective:    Patient ID: Scott Ibarra is a 65 y.o. male.  HPI     Star Age, MD, PhD [facility] [address] [place], [place] [address]  Dear Berta Minor,   I saw your patient, Scott Ibarra, upon your kind request in my clinic today for initial consultation of his sleep disorder, in particular, concern for underlying obstructive sleep apnea. The patient is accompanied by his wife today. As you know, Scott Ibarra is a 65 year old right-handed gentleman with an underlying medical history of psoriasis, memory loss, chronic lung d/s (nodules or scarring per patient d/t rubella), and obesity, who reports snoring and excessive daytime somnolence. He has recurrent, nearly daily headaches.  His wife has noted apneic pauses while he is asleep, but better in the last 2 years. He has lost nearly 90 lb in the last few years. His Epworth sleepiness score is 12 out of 24 today, his fatigue score is 41/63.  He worked in call centers for years. Now works part-time at a [employer], latest till 10 PM.  Bedtime depends on work schedule, 11:30 PM to 1 AM, wake up time around 9:30 PM.  He has nocturia about 3-4 times per night. Son was recently diagnosed with OSA, has a CPAP machine.  He denies RLS symptoms and wife reports no PLMs. He cannot sleep without TV, has tinnitus and needs the TV for its white noise properties. He tried a white noise machine but it did not help very much. He has typically no difficulty falling asleep but does have difficulty staying asleep. Sleep is interrupted and sometimes he has a difficult time going back to sleep after one of his bathroom breaks. He is scheduled to see a urologist soon.  He has had short term memory issues for about 1 year, worse in the last 6 months. Has not started the donepezil yet.   I reviewed your office note from 08/11/2015. You initiated workup for memory loss. He had a brain MRI without contrast on  08/28/2015. IMPRESSION:  This MRI of the brain without contrast shows the following: 1.    There are some scattered T2/FLAIR hyperintense foci in the subcortical and deep white matter of the hemispheres and in the pons consistent with mild chronic microvascular ischemic change, probably within normal limits for age. 2.    Brain volume is normal for age. 3.    No acute findings.   He was referred for neuropsychological evaluation and had an appointment last week, Dr. Vikki Ports, with results pending.  He has L knee pain, seen at [facility]. Had knee surgery on the L.   His Past Medical History Is Significant For: Past Medical History:  Diagnosis Date  . Psoriasis   . Varicose veins     His Past Surgical History Is Significant For: Past Surgical History:  Procedure Laterality Date  . CHOLECYSTECTOMY  2011  . ligation and stripping left  1994  . VARICOSE VEIN SURGERY  2013    His Family History Is Significant For: Family History  Problem Relation Age of Onset  . Stroke Mother   . Cancer Father     His Social History Is Significant For: Social History   Social History  . Marital status: Married    Spouse name: [name]  . Number of children: N/A  . Years of education: 72   Occupational History  . Catering manager- retired     Social History Main Topics  . Smoking  status: Never Smoker  . Smokeless tobacco: Never Used  . Alcohol use Yes  . Drug use: No  . Sexual activity: Not Asked   Other Topics Concern  . None   Social History Narrative   Lives at home with wife.   Caffeine use: 2-3 cups of coffee a day        His Allergies Are:  No Known Allergies:   His Current Medications Are:  Outpatient Encounter Prescriptions as of 09/09/2015  Medication Sig  . cyclobenzaprine (FLEXERIL) 5 MG tablet Take 5 mg by mouth 3 (three) times daily as needed for muscle spasms.  . naproxen (NAPROSYN) 500 MG tablet Take 500 mg by mouth 2 (two) times daily with a meal.  .  donepezil (ARICEPT) 10 MG tablet Take 1 tablet (10 mg total) by mouth at bedtime. (Patient not taking: Reported on 09/09/2015)   No facility-administered encounter medications on file as of 09/09/2015.   :  Review of Systems:  Out of a complete 14 point review of systems, all are reviewed and negative with the exception of these symptoms as listed below: Review of Systems  Neurological:       Patient has trouble staying asleep, goes to the bathroom during the night, snoring, witnessed apnea, wakes up feeling tired, headaches, daytime tiredness,    Epworth Sleepiness Scale 0= would never doze 1= slight chance of dozing 2= moderate chance of dozing 3= high chance of dozing  Sitting and reading:2 Watching TV:2 Sitting inactive in a public place (ex. Theater or meeting):2 As a passenger in a car for an hour without a break:1 Lying down to rest in the afternoon:3 Sitting and talking to someone:1 Sitting quietly after lunch (no alcohol):1 In a car, while stopped in traffic:0 Total:12  Objective:  Neurologic Exam  Physical Exam Physical Examination:   Vitals:   09/09/15 1331  BP: (!) 150/66  Pulse: 72  Resp: 18    General Examination: The patient is a very pleasant 65 y.o. male in no acute distress. He appears well-developed and well groomed.   HEENT: Normocephalic, atraumatic, pupils are equal, round and reactive to light and accommodation. He has corrective eye glasses. Extraocular tracking is good without limitation to gaze excursion or nystagmus noted. Normal smooth pursuit is noted. Hearing is grossly intact. Face is symmetric with normal facial animation and normal facial sensation. Speech is clear with no dysarthria noted. There is no hypophonia. There is no lip, neck/head, jaw or voice tremor. Neck is supple with full range of passive and active motion. There are no carotid bruits on auscultation. Oropharynx exam reveals: mild mouth dryness, adequate dental hygiene and moderate  airway crowding, due to large uvula and redundant soft palate. Mallampati is class II. Tongue protrudes centrally and palate elevates symmetrically. Tonsils are absent. Neck size is 16.75 inches. He has a Mild overbite.    Chest: Clear to auscultation without wheezing, rhonchi or crackles noted.  Heart: S1+S2+0, regular and normal without murmurs, rubs or gallops noted.   Abdomen: Soft, non-tender and non-distended with normal bowel sounds appreciated on auscultation.  Extremities: There is no pitting edema in the distal lower extremities bilaterally. Pedal pulses are intact.  Skin: Warm and dry without trophic changes noted. There are no varicose veins.  Musculoskeletal: exam reveals no obvious joint deformities, tenderness or joint swelling or erythema.   Neurologically:  Mental status: The patient is awake, alert and oriented in all 4 spheres. His immediate and remote memory, attention, language skills and  fund of knowledge are fairly appropriate, wife helps with details on Hx. Thought process is linear. Mood is normal and affect is mildly blunted.  Cranial nerves II - XII are as described above under HEENT exam. In addition: shoulder shrug is normal with equal shoulder height noted. Motor exam: Normal bulk, strength and tone is noted. There is no drift, tremor or rebound. Romberg is negative. Reflexes are 1+ in the UEs, trace in the LEs. Fine motor skills and coordination: intact with normal finger taps, normal hand movements, normal rapid alternating patting, normal foot taps and normal foot agility.  Cerebellar testing: No dysmetria or intention tremor on finger to nose testing. Heel to shin is unremarkable bilaterally. There is no truncal or gait ataxia.  Sensory exam: intact to light touch, vibration, and temperature sense in the upper and lower extremities.  Gait, station and balance: He stands with difficulty and reports L knee pain. No veering to one side is noted. No leaning to one  side is noted. Posture is age-appropriate and stance is narrow based. Gait shows normal stride length and normal pace. No problems turning are noted. Tandem walk is difficult for him.   Assessment and Plan:   In summary, OSMIN AKRIDGE is a very pleasant 65 y.o.-year old male with an underlying medical history of psoriasis, memory loss, chronic lung d/s (nodules or scarring per patient d/t rubella), and obesity, whose history and physical exam are concerning for obstructive sleep apnea (OSA). I had a long chat with the patient and his wife about my findings and the diagnosis of OSA, its prognosis and treatment options. We talked about medical treatments, surgical interventions and non-pharmacological approaches. I explained in particular the risks and ramifications of untreated moderate to severe OSA, especially with respect to developing cardiovascular disease down the Road, including congestive heart failure, difficult to treat hypertension, cardiac arrhythmias, or stroke. Even type 2 diabetes has, in part, been linked to untreated OSA. Symptoms of untreated OSA include daytime sleepiness, memory problems, mood irritability and mood disorder such as depression and anxiety, lack of energy, as well as recurrent headaches, especially morning headaches. We talked about trying to maintain a healthy lifestyle in general, as well as the importance of weight control. I encouraged the patient to eat healthy, exercise daily and keep well hydrated, to keep a scheduled bedtime and wake time routine, to not skip any meals and eat healthy snacks in between meals. I advised the patient not to drive when feeling sleepy. I recommended the following at this time: sleep study with potential positive airway pressure titration. (We will score hypopneas at 4% and split the sleep study into diagnostic and treatment portion, if the estimated. 2 hour AHI is >15/h).   I explained the sleep test procedure to the patient and also  outlined possible surgical and non-surgical treatment options of OSA, including the use of a custom-made dental device (which would require a referral to a specialist dentist or oral surgeon), upper airway surgical options, such as pillar implants, radiofrequency surgery, tongue base surgery, and UPPP (which would involve a referral to an ENT surgeon). Rarely, jaw surgery such as mandibular advancement may be considered.  I also explained the CPAP treatment option to the patient, who indicated that he would be willing to try CPAP if the need arises. I explained the importance of being compliant with PAP treatment, not only for insurance purposes but primarily to improve His symptoms, and for the patient's long term health benefit, including to  reduce His cardiovascular risks. I answered all their questions today and the patient and his wife were in agreement. I would like to see hm back after the sleep study is completed and encouraged him to call with any interim questions, concerns, problems or updates.   Thank you very much for allowing me to participate in the care of this nice patient. If I can be of any further assistance to you please do not hesitate to talk to me.  Sincerely,   Star Age, MD, PhD

## 2015-09-09 NOTE — Patient Instructions (Signed)

## 2015-09-16 ENCOUNTER — Telehealth: Payer: Self-pay | Admitting: Neurology

## 2015-09-16 NOTE — Telephone Encounter (Signed)
Called and spoke to pt wife and advised B12 was last done on 08/11/15 and was 339. She verbalized understanding and has no further questions at this time.

## 2015-09-16 NOTE — Telephone Encounter (Signed)
Pt's wife called inquiring if the labs that were drawn included B-12 deficiency. Please call

## 2015-09-23 ENCOUNTER — Ambulatory Visit (INDEPENDENT_AMBULATORY_CARE_PROVIDER_SITE_OTHER): Payer: Medicare Other | Admitting: Neurology

## 2015-09-23 DIAGNOSIS — G4710 Hypersomnia, unspecified: Secondary | ICD-10-CM

## 2015-09-23 DIAGNOSIS — G4761 Periodic limb movement disorder: Secondary | ICD-10-CM

## 2015-10-08 ENCOUNTER — Telehealth: Payer: Self-pay | Admitting: Neurology

## 2015-10-08 NOTE — Telephone Encounter (Signed)
Patient referred by Dr. Jaynee Eagles, seen by me on 09/09/15, diagnostic PSG on 09/23/15.   Please call and notify the patient that the recent sleep study did not show any significant obstructive sleep apnea. Please inform patient that I would like to go over the details of the study during a follow up appointment. Arrange a followup appointment. Also, route or fax report to PCP and referring MD, if other than PCP.  Once you have spoken to patient, you can close this encounter.   Thanks,  Star Age, MD, PhD [facility])

## 2015-10-08 NOTE — Telephone Encounter (Signed)
I spoke to patient and he is aware of results and recommendations. We made him an appt on Monday.

## 2015-10-09 ENCOUNTER — Telehealth: Payer: Self-pay | Admitting: Neurology

## 2015-10-12 ENCOUNTER — Ambulatory Visit: Payer: Self-pay | Admitting: Neurology

## 2015-10-27 ENCOUNTER — Ambulatory Visit (INDEPENDENT_AMBULATORY_CARE_PROVIDER_SITE_OTHER): Payer: Medicare Other | Admitting: Neurology

## 2015-10-27 VITALS — BP 141/83 | HR 74 | Ht 74.0 in | Wt 260.0 lb

## 2015-10-27 DIAGNOSIS — F988 Other specified behavioral and emotional disorders with onset usually occurring in childhood and adolescence: Secondary | ICD-10-CM

## 2015-10-27 DIAGNOSIS — F900 Attention-deficit hyperactivity disorder, predominantly inattentive type: Secondary | ICD-10-CM | POA: Diagnosis not present

## 2015-10-27 MED ORDER — AMPHETAMINE-DEXTROAMPHET ER 10 MG PO CP24: 10.0000 mg | ORAL_CAPSULE | Freq: Every day | ORAL | 0 refills | Status: DC

## 2015-10-27 NOTE — Patient Instructions (Addendum)
Amphetamine; Dextroamphetamine tablets Remember to drink plenty of fluid, eat healthy meals and do not skip any meals. Try to eat protein with a every meal and eat a healthy snack such as fruit or nuts in between meals. Try to keep a regular sleep-wake schedule and try to exercise daily, particularly in the form of walking, 20-30 minutes a day, if you can.   As far as your medications are concerned, I would like to suggest:   As far as diagnostic testing: EKG in 2 weeks  I would like to see you back in 3-4 months, sooner if we need to. Please call us with any interim questions, concerns, problems, updates or refill requests.   Our phone number is [phone]. We also have an after hours call service for urgent matters and there is a physician on-call for urgent questions. For any emergencies you know to call 911 or go to the nearest emergency room  What is this medicine? AMPHETAMINE; DEXTROAMPHETAMINE(am FET a meen; dex troe am FET a meen) is used to treat attention-deficit hyperactivity disorder (ADHD). It may also be used for narcolepsy. Federal law prohibits giving this medicine to any person other than the person for whom it was prescribed. Do not share this medicine with anyone else. This medicine may be used for other purposes; ask your health care provider or pharmacist if you have questions. What should I tell my health care provider before I take this medicine? They need to know if you have any of these conditions: -anxiety or panic attacks -circulation problems in fingers and toes -glaucoma -hardening or blockages of the arteries or heart blood vessels -heart disease or a heart defect -high blood pressure -history of a drug or alcohol abuse problem -history of stroke -kidney disease -liver disease -mental illness -seizures -suicidal thoughts, plans, or attempt; a previous suicide attempt by you or a family member -thyroid disease -Tourette's syndrome -an unusual or  allergic reaction to dextroamphetamine, other amphetamines, other medicines, foods, dyes, or preservatives -pregnant or trying to get pregnant -breast-feeding How should I use this medicine? Take this medicine by mouth with a glass of water. Follow the directions on the prescription label. Take your doses at regular intervals. Do not take your medicine more often than directed. Do not suddenly stop your medicine. You must gradually reduce the dose or you may feel withdrawal effects. Ask your doctor or health care professional for advice. Talk to your pediatrician regarding the use of this medicine in children. Special care may be needed. While this drug may be prescribed for children as young as 3 years for selected conditions, precautions do apply. Overdosage: If you think you have taken too much of this medicine contact a poison control center or emergency room at once. NOTE: This medicine is only for you. Do not share this medicine with others. What if I miss a dose? If you miss a dose, take it as soon as you can. If it is almost time for your next dose, take only that dose. Do not take double or extra doses. What may interact with this medicine? Do not take this medicine with any of the following medications: -MAOIS like Carbex, Eldepryl, Marplan, Nardil, and Parnate -other stimulant medicines for attention disorders, weight loss, or to stay awake This medicine may also interact with the following medications: -acetazolamide -ammonium chloride -antacids -ascorbic acid -atomoxetine -caffeine -certain medicines for blood pressure -certain medicines for depression, anxiety, or psychotic disturbances -certain medicines for seizures like carbamazepine, phenobarbital,  phenytoin -certain medicines for stomach problems like cimetidine, famotidine, omeprazole, lansoprazole -cold or allergy medicines -glutamic acid -lithium -meperidine -methenamine; sodium acid phosphate -narcotic medicines  for pain -norepinephrine -phenothiazines like chlorpromazine, mesoridazine, prochlorperazine, thioridazine -sodium acid phosphate -sodium bicarbonate This list may not describe all possible interactions. Give your health care provider a list of all the medicines, herbs, non-prescription drugs, or dietary supplements you use. Also tell them if you smoke, drink alcohol, or use illegal drugs. Some items may interact with your medicine. What should I watch for while using this medicine? Visit your doctor or health care professional for regular checks on your progress. This prescription requires that you follow special procedures with your doctor and pharmacy. You will need to have a new written prescription from your doctor every time you need a refill. This medicine may affect your concentration, or hide signs of tiredness. Until you know how this medicine affects you, do not drive, ride a bicycle, use machinery, or do anything that needs mental alertness. Tell your doctor or health care professional if this medicine loses its effects, or if you feel you need to take more than the prescribed amount. Do not change the dosage without talking to your doctor or health care professional. Decreased appetite is a common side effect when starting this medicine. Eating small, frequent meals or snacks can help. Talk to your doctor if you continue to have poor eating habits. Height and weight growth of a child taking this medicine will be monitored closely. Do not take this medicine close to bedtime. It may prevent you from sleeping. If you are going to need surgery, a MRI, CT scan, or other procedure, tell your doctor that you are taking this medicine. You may need to stop taking this medicine before the procedure. Tell your doctor or healthcare professional right away if you notice unexplained wounds on your fingers and toes while taking this medicine. You should also tell your healthcare provider if you experience  numbness or pain, changes in the skin color, or sensitivity to temperature in your fingers or toes. What side effects may I notice from receiving this medicine? Side effects that you should report to your doctor or health care professional as soon as possible: -allergic reactions like skin rash, itching or hives, swelling of the face, lips, or tongue -changes in vision -chest pain or chest tightness -confusion, trouble speaking or understanding -fast, irregular heartbeat -fingers or toes feel numb, cool, painful -hallucination, loss of contact with reality -high blood pressure -males: prolonged or painful erection -seizures -severe headaches -shortness of breath -suicidal thoughts or other mood changes -trouble walking, dizziness, loss of balance or coordination -uncontrollable head, mouth, neck, arm, or leg movements Side effects that usually do not require medical attention (report to your doctor or health care professional if they continue or are bothersome): -anxious -headache -loss of appetite -nausea, vomiting -trouble sleeping -weight loss This list may not describe all possible side effects. Call your doctor for medical advice about side effects. You may report side effects to FDA at 1-800-FDA-1088. Where should I keep my medicine? Keep out of the reach of children. This medicine can be abused. Keep your medicine in a safe place to protect it from theft. Do not share this medicine with anyone. Selling or giving away this medicine is dangerous and against the law. Store at room temperature between 15 and 30 degrees C (59 and 86 degrees F). Keep container tightly closed. Throw away any unused medicine after the expiration  date. Dispose of properly. This medicine may cause accidental overdose and death if it is taken by other adults, children, or pets. Mix any unused medicine with a substance like cat litter or coffee grounds. Then throw the medicine away in a sealed container like a  sealed bag or a coffee can with a lid. Do not use the medicine after the expiration date. NOTE: This sheet is a summary. It may not cover all possible information. If you have questions about this medicine, talk to your doctor, pharmacist, or health care provider.    2016, Elsevier/Gold Standard. (2013-10-23 18:44:41)

## 2015-10-27 NOTE — Progress Notes (Signed)
[facility]    Provider:  Dr Jaynee Eagles Referring Provider: [name]* Primary Care Physician:  [place]  CC:  Memory changes  Interval history 10/27/2015: Here for review of his neurocognitive testing which was overall very good without alzheimers. Sleep study was negative. 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   Neurocognitive testing: SUMMARY & IMPRESSION: [name] is a 65 year old, right-handed, married, Caucasian male with 13 years of education, who reported experiencing progressively worsening cognitive difficulties over the past 1-2 years. Onset was insidious with no precipitating factors endorsed.  Test results revealed average (if not above average to superior range) performance across most cognitive domains and thinking skills assessed during this evaluation with the exception of below expectation performances on tasks measuring certain executive cognitive processes (i.e., processing speed, complex attention, response inhibition). Memory problems were not found; in fact, learning and memory were a relative strength. Emotionally, [name] endorsed experiencing mild bouts of intermittent depressive symptoms.   [name] performance is consistent with a diagnosis of Mild Neurocognitive Disorder (i.e., mild cognitive impairment), predominantly dysexecutive. While an exact etiology is not entirely clear, these difficulties implicate frontal/subcortical system deficits that could be secondary to the small vessel disease as reportedly noted via neuroimaging, as while this is a common finding in older adults, it is not necessarily normal. I have no concerns for Alzheimer's disease.   Given these findings, the following recommendations are provided. Per request, a copy of this report will be mailed to the patient.        HPI:  Scott Ibarra is a 65 y.o.  male here as a referral from Dr. Sharmaine Base for memory changes. No significant PMHx.  Memory changes started a year ago and slowly worsening with increase over the last 6 months. Wife and daughter noticed. Patient just admitted. He forgets conversations, he can remember songs from the 34s, more short-term memory changes. He can;t remember how to get to locations he has been going to for 22 years. He couldn;t remember how to get here today. He finds driving confusing. Misplaces objects. Wife has always done the finances. Recently he has more mood swings. Daughetr and wife go into their rooms because they can;t be around him. His computer froze up and he threw his hands up in the air. He got extremely frustrated. No hallucinations, no delusions. No new medications. Has been on flexeril prn for years no new medications. No head trauma. Unknown if work is noticing. Still can use a computer. Irritability. Forgets conversations quickly, blank stares like he never heard it. He had depression 10 years ago when he was having a lot of stress. Denies current mood disorders. leep well, does not wake up refreshed, he snores but not as heavily as he used to. Mother with dementia. Other extended family members on both sides with dementia. Mostly mother's side. She developed dementia in late 3s or early 22s.  Review of Systems: Patient complains of symptoms per HPI as well as the following symptoms: Fatigue, frequency of urination, headache, memory loss, confusion, agitation, decreased concentration, aching muscles, frequent waking. Pertinent negatives per HPI. All others negative.    Social History   Social History  . Marital status: Married    Spouse name: [name]  . Number of children: N/A  . Years of education: 20   Occupational History  . Catering manager- retired     Social History Main Topics  . Smoking status:  Never Smoker  . Smokeless tobacco: Never Used  . Alcohol use Yes  . Drug use: No    . Sexual activity: Not on file   Other Topics Concern  . Not on file   Social History Narrative   Lives at home with wife.   Caffeine use: 2-3 cups of coffee a day        Family History  Problem Relation Age of Onset  . Stroke Mother   . Cancer Father     Past Medical History:  Diagnosis Date  . Psoriasis   . Varicose veins     Past Surgical History:  Procedure Laterality Date  . CHOLECYSTECTOMY  2011  . ligation and stripping left  1994  . VARICOSE VEIN SURGERY  2013    Current Outpatient Prescriptions  Medication Sig Dispense Refill  . cyclobenzaprine (FLEXERIL) 5 MG tablet Take 5 mg by mouth 3 (three) times daily as needed for muscle spasms.    Marland Kitchen donepezil (ARICEPT) 10 MG tablet Take 1 tablet (10 mg total) by mouth at bedtime. 30 tablet 12  . naproxen (NAPROSYN) 500 MG tablet Take 500 mg by mouth 2 (two) times daily with a meal.    . amphetamine-dextroamphetamine (ADDERALL XR) 10 MG 24 hr capsule Take 1 capsule (10 mg total) by mouth daily. 30 capsule 0   No current facility-administered medications for this visit.     Allergies as of 10/27/2015  . (No Known Allergies)    Vitals: BP (!) 141/83 (BP Location: Right Arm, Patient Position: Sitting, Cuff Size: Normal)   Pulse 74   Ht 6\' 2"  (1.88 m)   Wt 260 lb (117.9 kg)   BMI 33.38 kg/m  Last Weight:  Wt Readings from Last 1 Encounters:  10/27/15 260 lb (117.9 kg)   Last Height:   Ht Readings from Last 1 Encounters:  10/27/15 6\' 2"  (1.88 m)    Physical exam: Exam: Gen: NAD, conversant, well nourised, obese, well groomed                     Eyes: Conjunctivae clear without exudates or hemorrhage  Neuro: Detailed Neurologic Exam  Speech:    Speech is normal; fluent and spontaneous with normal comprehension.  Cognition:    The patient is oriented to person, place, and time  MMSE - Mini Mental State Exam 08/11/2015  Orientation to time 5  Orientation to Place 5  Registration 3  Attention/  Calculation 5  Recall 3  Language- name 2 objects 2  Language- repeat 1  Language- follow 3 step command 3  Language- read & follow direction 1  Write a sentence 1  Copy design 1  Total score 30     Cranial Nerves:    The pupils are equal, round, and reactive to light. . Visual fields are full to finger confrontation. Extraocular movements are intact. Trigeminal sensation is intact and the muscles of mastication are normal. The face is symmetric. The palate elevates in the midline. Hearing intact. Voice is normal. Shoulder shrug is normal. The tongue has normal motion without fasciculations.   Coordination:    Normal finger to nose and heel to shin. Normal rapid alternating movements.   Gait:    Not ataxic  Motor Observation:    No asymmetry, no atrophy, and no involuntary movements noted. Tone:    Normal muscle tone.    Posture:    Posture is normal. normal erect    Strength:    Strength  is V/V in the upper and lower limbs.         Assessment/Plan:  65 year old with memory changes a FHx of eraly-onset dementia in his mother. Here for review of his neurocognitive testing which was overall very good without alzheimers. Sleep study was negative. 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    MRi of the brain unremarkable for age TSH and B12 and RPR normal Discussed FDG Pet scan as well as our CREAD research trial. His cognition was too good to qualify.  Neurocognitive testing suggested ADHD Epworth Sleepiness scale 13, snoring, memory loss: sleep evaluation: No OSA Start Adderall discussed side effects as per patient instructions especialy HTN and ahrhythmia need an ekg in 2 weeks  F/u 3 months  [facility] [address] [place], [place] [address]  Phone [phone] Fax [phone]  A total of 30 minutes was spent face-to-face with this patient. Over half this time was spent on  counseling patient on the ADD diagnosis and different diagnostic and therapeutic options available.

## 2015-10-28 ENCOUNTER — Telehealth: Payer: Self-pay | Admitting: Neurology

## 2015-10-28 DIAGNOSIS — Z9189 Other specified personal risk factors, not elsewhere classified: Secondary | ICD-10-CM

## 2015-10-28 DIAGNOSIS — F988 Other specified behavioral and emotional disorders with onset usually occurring in childhood and adolescence: Secondary | ICD-10-CM | POA: Insufficient documentation

## 2015-10-28 NOTE — Telephone Encounter (Signed)
Scott Ibarra, I need to order an EKG for patient to have it done in about 2 weeks. What is the best order? Is it a referral for you to take care of or an order? Let me know thanks!

## 2015-10-29 ENCOUNTER — Other Ambulatory Visit: Payer: Self-pay | Admitting: Neurology

## 2015-10-29 MED ORDER — CYCLOBENZAPRINE HCL 5 MG PO TABS: 5.0000 mg | ORAL_TABLET | Freq: Three times a day (TID) | ORAL | 12 refills | Status: DC | PRN

## 2015-10-29 MED ORDER — NAPROXEN 500 MG PO TABS: 500.0000 mg | ORAL_TABLET | Freq: Two times a day (BID) | ORAL | 3 refills | Status: DC | PRN

## 2015-10-29 NOTE — Telephone Encounter (Signed)
Order sent  For EKG.

## 2015-10-29 NOTE — Telephone Encounter (Signed)
Dr. Jaynee Eagles Put in like a referral and I will take of for You . Advise me when you are done I will look for and call EKG and Patient.

## 2015-10-29 NOTE — Addendum Note (Signed)
Addended by: Hope Pigeon on: 10/29/2015 09:27 AM   Modules accepted: Orders

## 2015-10-29 NOTE — Telephone Encounter (Signed)
Pt's wife called to advise they rec'd a call from [facility] to schedule an appt. She is upset about the call and the person that called. Also she wants to know why he has to go to cardiologist for ekg. She said PCP can do it any radiology facility.  She said naproxen (NAPROSYN) 500 MG tablet and cyclobenzaprine (FLEXERIL) 5 MG tablet were not called to [facility]. Please call

## 2015-10-29 NOTE — Telephone Encounter (Signed)
That is where we get EKGs completed. He can call his primary care and see if he can go there if he wants.I will call in the naprosyn and flexeril. thanks

## 2015-10-29 NOTE — Telephone Encounter (Signed)
Dr Jaynee Eagles- how do you want to proceed with pt getting EKG? We normally put referral into cardiology and they call and set pt up to get EKG.   Ok to send in refills for medications?

## 2015-10-30 NOTE — Telephone Encounter (Signed)
Called and spoke to wife. Relayed per Dr Jaynee Eagles, this is how we normally send referrals over for EKG. She states they cannot afford appt to cardiology. They would prefer to go through PCP. She is going to contact PCP and see if this is possible. She will let us know what they say.  She states they picked up rx adderral and it cost them 124.95 for one month. Wanted me to relay to Dr Jaynee Eagles. Advised I will give her the message. She verbalized understanding.   Advised Dr Jaynee Eagles called in naproxyn and flexeril as requested.

## 2015-11-16 ENCOUNTER — Ambulatory Visit: Payer: Medicare Other | Admitting: Neurology

## 2015-11-17 ENCOUNTER — Telehealth: Payer: Self-pay | Admitting: Neurology

## 2015-11-17 NOTE — Telephone Encounter (Signed)
Wife [name] called to request call back from nurse Terrence Dupont regarding update on amphetamine-dextroamphetamine (ADDERALL XR) 10 MG 24 hr capsule.

## 2015-11-17 NOTE — Telephone Encounter (Signed)
Called wife back. She states adderall XR not helping per patient. He has noticed no change. BP constant, and has stayed the same. Still foggy, disoriented. He has had no other medication changes recently. She stated Dr Jaynee Eagles mentioned about switching to 2x/day regular release for adderall instead. This would be cheaper for them.  Advised I will get message to Dr Jaynee Eagles and call back tomorrow at the latest after I receive answer from Dr Jaynee Eagles.  She states they will be gone in the morning tomorrow for his urology appt. Ok to LVM.

## 2015-11-18 ENCOUNTER — Other Ambulatory Visit: Payer: Self-pay | Admitting: Neurology

## 2015-11-18 ENCOUNTER — Ambulatory Visit: Payer: Medicare Other | Admitting: Neurology

## 2015-11-18 MED ORDER — AMPHETAMINE-DEXTROAMPHETAMINE 15 MG PO TABS: 15.0000 mg | ORAL_TABLET | Freq: Two times a day (BID) | ORAL | 0 refills | Status: DC

## 2015-11-18 NOTE — Telephone Encounter (Signed)
I will order the IR can we fax it or do they need to pick it up? Left ob your desk thanks

## 2015-11-19 NOTE — Telephone Encounter (Signed)
Called and spoke to Crystal (wife). Advised Dr Jaynee Eagles giving new rx for adderall regular release. Advised they will have to pick up from our office since it is a controlled substance. She verbalize understanding. I placed up front for pick up.

## 2016-01-18 ENCOUNTER — Other Ambulatory Visit: Payer: Self-pay | Admitting: *Deleted

## 2016-01-18 MED ORDER — AMPHETAMINE-DEXTROAMPHETAMINE 15 MG PO TABS: 15.0000 mg | ORAL_TABLET | Freq: Two times a day (BID) | ORAL | 0 refills | Status: DC

## 2016-01-18 NOTE — Progress Notes (Signed)
Pt picked up printed/signed rx in office today.

## 2016-02-03 ENCOUNTER — Ambulatory Visit: Payer: Medicare Other | Admitting: Neurology

## 2016-02-11 ENCOUNTER — Ambulatory Visit: Payer: Self-pay | Admitting: Neurology

## 2016-02-11 ENCOUNTER — Telehealth: Payer: Self-pay

## 2016-02-11 NOTE — Telephone Encounter (Signed)
Called and left mssg for pt to r/s his appt for this afternoon.

## 2016-02-11 NOTE — Telephone Encounter (Signed)
Dr. Jaynee Eagles contacted pt/family and will r/s this afternoon's appt at a later date.

## 2016-02-15 ENCOUNTER — Ambulatory Visit (INDEPENDENT_AMBULATORY_CARE_PROVIDER_SITE_OTHER): Payer: Medicare Other | Admitting: Neurology

## 2016-02-15 ENCOUNTER — Encounter: Payer: Self-pay | Admitting: Neurology

## 2016-02-15 VITALS — BP 147/81 | HR 87 | Ht 74.0 in | Wt 260.0 lb

## 2016-02-15 DIAGNOSIS — R41840 Attention and concentration deficit: Secondary | ICD-10-CM | POA: Diagnosis not present

## 2016-02-15 DIAGNOSIS — R454 Irritability and anger: Secondary | ICD-10-CM

## 2016-02-15 MED ORDER — BUPROPION HCL ER (XL) 150 MG PO TB24: ORAL_TABLET | ORAL | 6 refills | Status: DC

## 2016-02-15 NOTE — Patient Instructions (Addendum)
Remember to drink plenty of fluid, eat healthy meals and do not skip any meals. Try to eat protein with a every meal and eat a healthy snack such as fruit or nuts in between meals. Try to keep a regular sleep-wake schedule and try to exercise daily, particularly in the form of walking, 20-30 minutes a day, if you can.   As far as your medications are concerned, I would like to suggest: Wellbutrin XL take one pill in the morning(150mg ). In 1 week increase to 2 pills(300mg ) in the morning.  I would like to see you back in 4 months, sooner if we need to. Please call us with any interim questions, concerns, problems, updates or refill requests.   Our phone number is [phone]. We also have an after hours call service for urgent matters and there is a physician on-call for urgent questions. For any emergencies you know to call 911 or go to the nearest emergency room  Bupropion extended-release tablets (Depression/Mood Disorders) What is this medicine? BUPROPION (byoo PROE pee on) is used to treat depression, ADHD, Obesity. This medicine may be used for other purposes; ask your health care provider or pharmacist if you have questions. COMMON BRAND NAME(S): Aplenzin, Budeprion XL, Forfivo XL, Wellbutrin XL What should I tell my health care provider before I take this medicine? They need to know if you have any of these conditions: -an eating disorder, such as anorexia or bulimia -bipolar disorder or psychosis -diabetes or high blood sugar, treated with medication -glaucoma -head injury or brain tumor -heart disease, previous heart attack, or irregular heart beat -high blood pressure -kidney or liver disease -seizures (convulsions) -suicidal thoughts or a previous suicide attempt -Tourette's syndrome -weight loss -an unusual or allergic reaction to bupropion, other medicines, foods, dyes, or preservatives -breast-feeding -pregnant or trying to become pregnant How should I use this  medicine? Take this medicine by mouth with a glass of water. Follow the directions on the prescription label. You can take it with or without food. If it upsets your stomach, take it with food. Do not crush, chew, or cut these tablets. This medicine is taken once daily at the same time each day. Do not take your medicine more often than directed. Do not stop taking this medicine suddenly except upon the advice of your doctor. Stopping this medicine too quickly may cause serious side effects or your condition may worsen. A special MedGuide will be given to you by the pharmacist with each prescription and refill. Be sure to read this information carefully each time. Talk to your pediatrician regarding the use of this medicine in children. Special care may be needed. Overdosage: If you think you have taken too much of this medicine contact a poison control center or emergency room at once. NOTE: This medicine is only for you. Do not share this medicine with others. What if I miss a dose? If you miss a dose, skip the missed dose and take your next tablet at the regular time. Do not take double or extra doses. What may interact with this medicine? Do not take this medicine with any of the following medications: -linezolid -MAOIs like Azilect, Carbex, Eldepryl, Marplan, Nardil, and Parnate -methylene blue (injected into a vein) -other medicines that contain bupropion like Zyban This medicine may also interact with the following medications: -alcohol -certain medicines for anxiety or sleep -certain medicines for blood pressure like metoprolol, propranolol -certain medicines for depression or psychotic disturbances -certain medicines for HIV or AIDS like  efavirenz, lopinavir, nelfinavir, ritonavir -certain medicines for irregular heart beat like propafenone, flecainide -certain medicines for Parkinson's disease like amantadine, levodopa -certain medicines for seizures like carbamazepine, phenytoin,  phenobarbital -cimetidine -clopidogrel -cyclophosphamide -digoxin -furazolidone -isoniazid -nicotine -orphenadrine -procarbazine -steroid medicines like prednisone or cortisone -stimulant medicines for attention disorders, weight loss, or to stay awake -tamoxifen -theophylline -thiotepa -ticlopidine -tramadol -warfarin This list may not describe all possible interactions. Give your health care provider a list of all the medicines, herbs, non-prescription drugs, or dietary supplements you use. Also tell them if you smoke, drink alcohol, or use illegal drugs. Some items may interact with your medicine. What should I watch for while using this medicine? Tell your doctor if your symptoms do not get better or if they get worse. Visit your doctor or health care professional for regular checks on your progress. Because it may take several weeks to see the full effects of this medicine, it is important to continue your treatment as prescribed by your doctor. Patients and their families should watch out for new or worsening thoughts of suicide or depression. Also watch out for sudden changes in feelings such as feeling anxious, agitated, panicky, irritable, hostile, aggressive, impulsive, severely restless, overly excited and hyperactive, or not being able to sleep. If this happens, especially at the beginning of treatment or after a change in dose, call your health care professional. Avoid alcoholic drinks while taking this medicine. Drinking large amounts of alcoholic beverages, using sleeping or anxiety medicines, or quickly stopping the use of these agents while taking this medicine may increase your risk for a seizure. Do not drive or use heavy machinery until you know how this medicine affects you. This medicine can impair your ability to perform these tasks. Do not take this medicine close to bedtime. It may prevent you from sleeping. Your mouth may get dry. Chewing sugarless gum or sucking  hard candy, and drinking plenty of water may help. Contact your doctor if the problem does not go away or is severe. The tablet shell for some brands of this medicine does not dissolve. This is normal. The tablet shell may appear whole in the stool. This is not a cause for concern. What side effects may I notice from receiving this medicine? Side effects that you should report to your doctor or health care professional as soon as possible: -allergic reactions like skin rash, itching or hives, swelling of the face, lips, or tongue -breathing problems -changes in vision -confusion -elevated mood, decreased need for sleep, racing thoughts, impulsive behavior -fast or irregular heartbeat -hallucinations, loss of contact with reality -increased blood pressure -redness, blistering, peeling or loosening of the skin, including inside the mouth -seizures -suicidal thoughts or other mood changes -unusually weak or tired -vomiting Side effects that usually do not require medical attention (report to your doctor or health care professional if they continue or are bothersome): -constipation -headache -loss of appetite -nausea -tremors -weight loss This list may not describe all possible side effects. Call your doctor for medical advice about side effects. You may report side effects to FDA at 1-800-FDA-1088. Where should I keep my medicine? Keep out of the reach of children. Store at room temperature between 15 and 30 degrees C (59 and 86 degrees F). Throw away any unused medicine after the expiration date. NOTE: This sheet is a summary. It may not cover all possible information. If you have questions about this medicine, talk to your doctor, pharmacist, or health care provider.  2017 Elsevier/Gold  Standard (2015-06-12 13:55:13)

## 2016-02-15 NOTE — Progress Notes (Signed)
[facility]    Provider:  Dr Scott Ibarra Referring Provider: Josefa Ibarra* Primary Care Physician:  Scott Ibarra  CC: Memory changes  Interval history 02/15/2016: Patient is here for evaluation of attention problems and perceived memory changes. Formal neuropsychiatric testing showed no evidence of dementia or mild cognitive impairment. But his neuropsychiatric testing did show decreased attention. He does suffer with some mood disorders in his family says he is very irritable. He tried Adderall to see if this helps his attention and perceived memory changes which it did mildly but it says it makes him feel strange. He can try Wellbutrin instead which may help with his mood as well. Discussed this today and discussed side effects to Wellbutrin.  Interval history 10/27/2015: Here for review of his neurocognitive testing which was overall very good without alzheimers. Sleep study was negative. 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   Neurocognitive testing: SUMMARY & IMPRESSION: [name] is a 66 year old, right-handed, married, Caucasian male with 13 years of education, who reported experiencing progressively worsening cognitive difficulties over the past 1-2 years. Onset was insidious with no precipitating factors endorsed.  Test results revealed average (if not above average to superior range) performance across most cognitive domains and thinking skills assessed during this evaluation with the exception of below expectation performances on tasks measuring certain executive cognitive processes (i.e., processing speed, complex attention, response inhibition). Memory problems were not found; in fact, learning and memory were a relative strength. Emotionally, [name] endorsed experiencing mild bouts of intermittent depressive symptoms.   [name] performance is consistent  with a diagnosis of Mild Neurocognitive Disorder (i.e., mild cognitive impairment), predominantly dysexecutive. While an exact etiology is not entirely clear, these difficulties implicate frontal/subcortical system deficits that could be secondary to the small vessel disease as reportedly noted via neuroimaging, as while this is a common finding in older adults, it is not necessarily normal. I have no concerns for Alzheimer's disease.   Given these findings, the following recommendations are provided. Per request, a copy of this report will be mailed to the patient.       [id number] Scott Holstonis a 66 y.o.malehere as a referral from Dr. Edwyna Ibarra memory changes. No significant PMHx. Memory changes started a year ago and slowly worsening with increase over the last 6 months. Wife and daughter noticed. Patient just admitted. He forgets conversations, he can remember songs from the 79s, more short-term memory changes. He can;t remember how to get to locations he has been going to for 22 years. He couldn;t remember how to get here today. He finds driving confusing. Misplaces objects. Wife has always done the finances. Recently he has more mood swings. Daughetr and wife go into their rooms because they can;t be around him. His computer froze up and he threw his hands up in the air. He got extremely frustrated. No hallucinations, no delusions. No new medications. Has been on flexeril prn for years no new medications. No head trauma. Unknown if work is noticing. Still can use a computer. Irritability. Forgets conversations quickly, blank stares like he never heard it. He had depression 10 years ago when he was having a lot of stress. Denies current mood disorders. leep well, does not wake up refreshed, he snores but not as heavily as he used to. Mother with dementia. Other extended family members on both sides with dementia. Mostly mother's side. She developed dementia in late 80s or early  70s.  Review of Systems: Patient complains of symptoms per HPI as well as the following symptoms: Fatigue, frequency of urination, headache, memory loss, confusion, agitation, decreased concentration, aching muscles, frequent waking. Pertinent negatives per HPI. All others negative.   Social History   Social History  . Marital status: Married    Spouse name: Scott Ibarra  . Number of children: N/A  . Years of education: 47   Occupational History  . Catering manager- retired     Social History Main Topics  . Smoking status: Never Smoker  . Smokeless tobacco: Never Used  . Alcohol use Yes  . Drug use: No  . Sexual activity: Not on file   Other Topics Concern  . Not on file   Social History Narrative   Lives at home with wife.   Caffeine use: 2-3 cups of coffee a day    Right-handed       Family History  Problem Relation Age of Onset  . Stroke Mother   . Cancer Father     Past Medical History:  Diagnosis Date  . Psoriasis   . Varicose veins     Past Surgical History:  Procedure Laterality Date  . CHOLECYSTECTOMY  2011  . ligation and stripping left  1994  . VARICOSE VEIN SURGERY  2013    Current Outpatient Prescriptions  Medication Sig Dispense Refill  . alfuzosin (UROXATRAL) 10 MG 24 hr tablet     . amphetamine-dextroamphetamine (ADDERALL) 15 MG tablet Take 1 tablet by mouth 2 (two) times daily. 60 tablet 0  . clobetasol cream (TEMOVATE) 0.05 %     . cyclobenzaprine (FLEXERIL) 5 MG tablet Take 1 tablet (5 mg total) by mouth 3 (three) times daily as needed for muscle spasms. 30 tablet 12  . naproxen (NAPROSYN) 500 MG tablet Take 1 tablet (500 mg total) by mouth 2 (two) times daily as needed. Take with meals. Watch for GI upset or red or dark stools 60 tablet 3  . triamcinolone cream (KENALOG) 0.1 %      No current facility-administered medications for this visit.     Allergies as of 02/15/2016  . (No Known Allergies)    Vitals: BP (!) 147/81    Pulse 87   Ht 6\' 2"  (1.88 m)   Wt 260 lb (117.9 kg)   BMI 33.38 kg/m  Last Weight:  Wt Readings from Last 1 Encounters:  02/15/16 260 lb (117.9 kg)   Last Height:   Ht Readings from Last 1 Encounters:  02/15/16 6\' 2"  (1.88 m)     Cranial Nerves: The pupils are equal, round, and reactive to light. . Visual fields are full to finger confrontation. Extraocular movements are intact. Trigeminal sensation is intact and the muscles of mastication are normal. The face is symmetric. The palate elevates in the midline. Hearing intact. Voice is normal. Shoulder shrug is normal. The tongue has normal motion without fasciculations.   Coordination: Normal finger to nose and heel to shin. Normal rapid alternating movements.   Gait: Not ataxic  Motor Observation: No asymmetry, no atrophy, and no involuntary movements noted. Tone: Normal muscle tone.   Posture: Posture is normal. normal erect  Strength: Strength is V/V in the upper and lower limbs.     Assessment/Plan:66 year old with memory changes a FHx of eraly-onset dementia in his mother. Here for review of his neurocognitive testing which was overall very good without alzheimers. Sleep study was negative. Discussed his inattention, based on his neurocognitive testing suspect ADD  inattentive type.  He does suffer with some mood disorders and his family says he is very irritable. He tried Adderall to see if this helps his attention and perceived memory changes which it did mildly but he says it makes him feel strange. He can try Wellbutrin instead which may help with his mood as well. Discussed this today and discussed side effects to Wellbutrin.    MRi of the brain unremarkable for age TSH and B12 and RPR normal Discussed FDG Pet scan as well as our CREAD research trial. His cognition was too good to qualify.  Neurocognitive testing: No signs of dementia and not even mild cognitive impairment but  impaired attention Epworth Sleepiness scale 13, snoring, memory loss: sleep evaluation: No OSA Start Wellbutrin discussed side effects as per patient instructions especialy HTN and ahrhythmia.   F/u 3 months  [facility] [address] [place], [place] [address]  Phone [phone] Fax [phone]  A total of 25  minutes was spent face-to-face with this patient. Over Ibarra this time was spent on counseling patient on the ADD diagnosis, irritability and different diagnostic and therapeutic options available.

## 2016-05-11 ENCOUNTER — Encounter: Payer: Self-pay | Admitting: Neurology

## 2016-06-14 ENCOUNTER — Ambulatory Visit: Payer: Medicare Other | Admitting: Neurology

## 2016-07-01 ENCOUNTER — Emergency Department (HOSPITAL_COMMUNITY)
Admission: EM | Admit: 2016-07-01 | Discharge: 2016-07-01 | Disposition: A | Discharge status: Home / Self Care | Payer: Medicare Other | Attending: Emergency Medicine | Admitting: Emergency Medicine

## 2016-07-01 ENCOUNTER — Encounter (HOSPITAL_COMMUNITY): Payer: Self-pay | Admitting: *Deleted

## 2016-07-01 ENCOUNTER — Emergency Department (HOSPITAL_BASED_OUTPATIENT_CLINIC_OR_DEPARTMENT_OTHER)
Admit: 2016-07-01 | Discharge: 2016-07-01 | Disposition: A | Discharge status: Home / Self Care | Payer: Medicare Other | Attending: Emergency Medicine | Admitting: Emergency Medicine

## 2016-07-01 DIAGNOSIS — Z79899 Other long term (current) drug therapy: Secondary | ICD-10-CM | POA: Diagnosis not present

## 2016-07-01 DIAGNOSIS — M7989 Other specified soft tissue disorders: Secondary | ICD-10-CM

## 2016-07-01 DIAGNOSIS — M7121 Synovial cyst of popliteal space [Baker], right knee: Secondary | ICD-10-CM

## 2016-07-01 DIAGNOSIS — F900 Attention-deficit hyperactivity disorder, predominantly inattentive type: Secondary | ICD-10-CM | POA: Diagnosis not present

## 2016-07-01 DIAGNOSIS — M79604 Pain in right leg: Secondary | ICD-10-CM | POA: Diagnosis present

## 2016-07-01 LAB — CBC WITH DIFFERENTIAL/PLATELET
BASOS ABS: 0 10*3/uL (ref 0.0–0.1)
Basophils Relative: 1 %
EOS PCT: 2 %
Eosinophils Absolute: 0.2 10*3/uL (ref 0.0–0.7)
HCT: 44.6 % (ref 39.0–52.0)
Hemoglobin: 14.7 g/dL (ref 13.0–17.0)
LYMPHS PCT: 20 %
Lymphs Abs: 1.5 10*3/uL (ref 0.7–4.0)
MCH: 29.8 pg (ref 26.0–34.0)
MCHC: 33 g/dL (ref 30.0–36.0)
MCV: 90.5 fL (ref 78.0–100.0)
MONO ABS: 0.4 10*3/uL (ref 0.1–1.0)
Monocytes Relative: 5 %
Neutro Abs: 5.3 10*3/uL (ref 1.7–7.7)
Neutrophils Relative %: 72 %
PLATELETS: 157 10*3/uL (ref 150–400)
RBC: 4.93 MIL/uL (ref 4.22–5.81)
RDW: 13.5 % (ref 11.5–15.5)
WBC: 7.5 10*3/uL (ref 4.0–10.5)

## 2016-07-01 LAB — COMPREHENSIVE METABOLIC PANEL
ALK PHOS: 48 U/L (ref 38–126)
ALT: 22 U/L (ref 17–63)
AST: 23 U/L (ref 15–41)
Albumin: 3.9 g/dL (ref 3.5–5.0)
Anion gap: 9 (ref 5–15)
BILIRUBIN TOTAL: 1.1 mg/dL (ref 0.3–1.2)
BUN: 11 mg/dL (ref 6–20)
CALCIUM: 9 mg/dL (ref 8.9–10.3)
CHLORIDE: 107 mmol/L (ref 101–111)
CO2: 22 mmol/L (ref 22–32)
CREATININE: 0.93 mg/dL (ref 0.61–1.24)
Glucose, Bld: 105 mg/dL — ABNORMAL HIGH (ref 65–99)
Potassium: 3.9 mmol/L (ref 3.5–5.1)
Sodium: 138 mmol/L (ref 135–145)
TOTAL PROTEIN: 6.7 g/dL (ref 6.5–8.1)

## 2016-07-01 MED ORDER — TRAMADOL HCL 50 MG PO TABS: 50.0000 mg | ORAL_TABLET | Freq: Four times a day (QID) | ORAL | 0 refills | Status: DC | PRN

## 2016-07-01 NOTE — ED Triage Notes (Signed)
Pt c/o R leg pain & swelling onset x 5 days, pt sent by PCP for r/o DVT, pt denies SOB, n/v/d, A&O x4

## 2016-07-01 NOTE — Progress Notes (Signed)
*  PRELIMINARY RESULTS* Vascular Ultrasound Right lower extremity venous duplex has been completed.  Preliminary findings: No evidence of deep vein thrombosis in the visualized veins of the right lower extremity.  Moderate sized baker's cyst noted in the right popliteal fossa.  Patient returned to ED waiting room at 17:10   Everrett Coombe 07/01/2016, 5:10 PM

## 2016-07-01 NOTE — Discharge Instructions (Signed)
Please read attached information. If you experience any new or worsening signs or symptoms please return to the emergency room for evaluation. Please follow-up with your primary care provider or specialist as discussed. Please use medication prescribed only as directed and discontinue taking if you have any concerning signs or symptoms.   °

## 2016-07-01 NOTE — ED Provider Notes (Signed)
[facility] DEPT Provider Note   CSN: [id number] Arrival date & time: 07/01/16  1537     History   Chief Complaint Chief Complaint  Patient presents with  . Leg Swelling    HPI Scott Ibarra is a 66 y.o. male.  HPI    66 year old male presents today with complaints of right leg pain.  Patient notes he has had significant pain to his right lower extremity located around the right medial calf.  He notes pain is worse with ambulation, he notes swelling to the right lower extremity.  He reports symptoms are worsened after prolonged standing, improved with rest and elevation.  Patient denies any fever, warmth to touch, or any infectious etiology.  He denies any chest pain shortness of breath, orthopnea, or cardiac disease.  Patient was seen by his primary care and sent over for ultrasound for DVT rule out.  Pt  denies any trauma, no history of the same. Pt works at [employer], drilling.   Past Medical History:  Diagnosis Date  . Psoriasis   . Varicose veins     Patient Active Problem List   Diagnosis Date Noted  . 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 Varicose veins of lower extremities with other complications 33/29/5188    Past Surgical History:  Procedure Laterality Date  . CHOLECYSTECTOMY  2011  . ligation and stripping left  1994  . VARICOSE VEIN SURGERY  2013       Home Medications    Prior to Admission medications   Medication Sig Start Date End Date Taking? Authorizing Provider  alfuzosin (UROXATRAL) 10 MG 24 hr tablet  01/27/16   [provider]  amphetamine-dextroamphetamine (ADDERALL) 15 MG tablet Take 1 tablet by mouth 2 (two) times daily. 01/18/16   Melvenia Beam, MD  buPROPion (WELLBUTRIN XL) 150 MG 24 hr tablet Start with one pill(150mg ) in the morning. Can increase to 2 pills(300mg ) in the morning in one week. 02/15/16   Melvenia Beam, MD  clobetasol cream (TEMOVATE)  0.05 %  12/09/15   [provider]  cyclobenzaprine (FLEXERIL) 5 MG tablet Take 1 tablet (5 mg total) by mouth 3 (three) times daily as needed for muscle spasms. 10/29/15   Melvenia Beam, MD  naproxen (NAPROSYN) 500 MG tablet Take 1 tablet (500 mg total) by mouth 2 (two) times daily as needed. Take with meals. Watch for GI upset or red or dark stools 10/29/15   Melvenia Beam, MD  traMADol (ULTRAM) 50 MG tablet Take 1 tablet (50 mg total) by mouth every 6 (six) hours as needed. 07/01/16   Ariya Bohannon, Dellis Filbert, PA-C  triamcinolone cream (KENALOG) 0.1 %  12/31/15   [provider]    Family History Family History  Problem Relation Age of Onset  . Stroke Mother   . Cancer Father     Social History Social History  Substance Use Topics  . Smoking status: Never Smoker  . Smokeless tobacco: Never Used  . Alcohol use Yes     Allergies   Patient has no known allergies.   Review of Systems Review of Systems  All other systems reviewed and are negative.  Physical Exam Updated Vital Signs BP (!) 146/88 (BP Location: Right Arm)   Pulse 62   Temp 97.7 F (36.5 C) (Oral)   Resp 18   Ht 6\' 1"  (1.854 m)   Wt 118.4 kg (261 lb)   SpO2 99%  BMI 34.43 kg/m   Physical Exam  Constitutional: He is oriented to person, place, and time. He appears well-developed and well-nourished.  HENT:  Head: Normocephalic and atraumatic.  Eyes: Conjunctivae are normal. Pupils are equal, round, and reactive to light. Right eye exhibits no discharge. Left eye exhibits no discharge. No scleral icterus.  Neck: Normal range of motion. No JVD present. No tracheal deviation present.  Pulmonary/Chest: Effort normal. No stridor.  Musculoskeletal:  Swelling noted to the right distal extremity from the knee to the ankle, no warmth to touch.  Pedal pulses 2+, sensation intact, warm and well-perfused.  Tenderness to palpation of right medial calf  Neurological: He is alert and oriented to person,  place, and time. Coordination normal.  Psychiatric: He has a normal mood and affect. His behavior is normal. Judgment and thought content normal.  Nursing note and vitals reviewed.    ED Treatments / Results  Labs (all labs ordered are listed, but only abnormal results are displayed) Labs Reviewed  COMPREHENSIVE METABOLIC PANEL - Abnormal; Notable for the following:       Result Value   Glucose, Bld 105 (*)    All other components within normal limits  CBC WITH DIFFERENTIAL/PLATELET    EKG  EKG Interpretation None       Radiology No results found.  Procedures Procedures (including critical care time)  Medications Ordered in ED Medications - No data to display   Initial Impression / Assessment and Plan / ED Course  I have reviewed the triage vital signs and the nursing notes.  Pertinent labs & imaging results that were available during my care of the patient were reviewed by me and considered in my medical decision making (see chart for details).      Final Clinical Impressions(s) / ED Diagnoses   Final diagnoses:  Synovial cyst of right popliteal space  Leg swelling   Labs: CBC, CMP  Imaging: DVT study  Consults:   Therapeutics:  Discharge Meds:  Ultram   Assessment/Plan:  1 YOM presents for DVT rule out from his primary care.  Patient has swelling to the right lower extremity for the last week.  Improved with elevation.  He has no signs of infectious etiology, no traumatic findings.  Patient has a Baker's cyst, no evidence of DVT.  Patient denies any heart failure characteristics, low suspicion for any systemic disease.  Patient will need outpatient follow-up, instructed to use ibuprofen or Tylenol as needed for discomfort, tramadol as needed for more severe pain.  Patient's wife is at bedside he was very unhappy with today's visit.  She was very upset that they had the ultrasound study and that they still had to wait to be evaluated.  I informed her that  we would be unable to order studies in the emergency room setting and discharge patients without reviewing the patient in entirety.   Patient will be discharged home with return precautions, care follow-up.  Patient verbalized understanding and agreement to this plan and had no further questions or concerns at the time discharge.    New Prescriptions New Prescriptions   TRAMADOL (ULTRAM) 50 MG TABLET    Take 1 tablet (50 mg total) by mouth every 6 (six) hours as needed.     Okey Regal, PA-C 07/01/16 [place], [place], DO 07/01/16 2207

## 2016-07-01 NOTE — ED Notes (Signed)
Pt family member up at nurse first requesting update, apologized for delay.

## 2016-07-18 ENCOUNTER — Telehealth: Payer: Self-pay | Admitting: *Deleted

## 2016-07-18 NOTE — Telephone Encounter (Signed)
Patients wife called stating patient is having swelling and pain behind his right knee.  He was seen in ED 07/01/2016 and was directed to schedule appointment with Dr Kellie Simmering.  Appointment was scheduled 07/20/2016 at 1:30.

## 2016-07-20 ENCOUNTER — Encounter: Payer: Self-pay | Admitting: Vascular Surgery

## 2016-07-20 ENCOUNTER — Ambulatory Visit (INDEPENDENT_AMBULATORY_CARE_PROVIDER_SITE_OTHER): Payer: Medicare Other | Admitting: Vascular Surgery

## 2016-07-20 VITALS — BP 138/90 | HR 75 | Temp 97.3°F | Resp 18 | Ht 73.75 in | Wt 257.0 lb

## 2016-07-20 DIAGNOSIS — R600 Localized edema: Secondary | ICD-10-CM | POA: Insufficient documentation

## 2016-07-20 NOTE — Progress Notes (Signed)
Subjective:     Patient ID: Scott Ibarra, male   DOB: [birth year], 66 y.o.   MRN: [id number]  HPI This 66 year old male is known to me having undergone laser ablation left great saphenous vein with multiple stab phlebectomy of painful varicosities in 2014. He had a good result and states that he has had no swelling or pain or recurrent varicosities in the left leg since that time. His biggest complaint in his lower extremities is related to bilateral knee pain due to arthritis. 2-3 weeks ago he developed swelling in the right calf and went to the emergency department. A venous ultrasound exam which I have reviewed reveals no evidence of DVT or significant reflux in the superficial venous system. Swelling has improved slightly since that time. He does have a Baker's cyst in the right popliteal fossa noted on the ultrasound. He is wearing short leg elastic compression stockings sporadically. He does not elevate his legs consistently. He has no history of DVT thrombophlebitis stasis ulcers or bleeding.  Past Medical History:  Diagnosis Date  . Psoriasis   . Varicose veins     Social History  Substance Use Topics  . Smoking status: Never Smoker  . Smokeless tobacco: Never Used  . Alcohol use Yes    Family History  Problem Relation Age of Onset  . Stroke Mother   . Cancer Father     No Known Allergies   Current Outpatient Prescriptions:  .  alfuzosin (UROXATRAL) 10 MG 24 hr tablet, , Disp: , Rfl:  .  buPROPion (WELLBUTRIN XL) 150 MG 24 hr tablet, Start with one pill(150mg ) in the morning. Can increase to 2 pills(300mg ) in the morning in one week., Disp: 60 tablet, Rfl: 6 .  clobetasol cream (TEMOVATE) 0.05 %, , Disp: , Rfl:  .  cyclobenzaprine (FLEXERIL) 5 MG tablet, Take 1 tablet (5 mg total) by mouth 3 (three) times daily as needed for muscle spasms., Disp: 30 tablet, Rfl: 12 .  naproxen (NAPROSYN) 500 MG tablet, Take 1 tablet (500 mg total) by mouth 2 (two) times daily as needed.  Take with meals. Watch for GI upset or red or dark stools, Disp: 60 tablet, Rfl: 3 .  traMADol (ULTRAM) 50 MG tablet, Take 1 tablet (50 mg total) by mouth every 6 (six) hours as needed., Disp: 15 tablet, Rfl: 0 .  triamcinolone cream (KENALOG) 0.1 %, , Disp: , Rfl:  .  amphetamine-dextroamphetamine (ADDERALL) 15 MG tablet, Take 1 tablet by mouth 2 (two) times daily. (Patient not taking: Reported on 07/20/2016), Disp: 60 tablet, Rfl: 0  Vitals:   07/20/16 1315  BP: 138/90  Pulse: 75  Resp: 18  Temp: (!) 97.3 F (36.3 C)  TempSrc: Oral  Weight: 257 lb (116.6 kg)  Height: 6' 1.75" (1.873 m)    Body mass index is 33.22 kg/m.            Marland KitchenReview of Systems Denies chest pain, dyspnea on exertion, PND, orthopnea, hemoptysis. Primarily has bilateral knee pain due to arthritis and has been recommended to have bilateral knee replacements. Also has psoriasis.    Objective:   Physical Exam BP 138/90 (BP Location: Left Arm, Patient Position: Sitting, Cuff Size: Large)   Pulse 75   Temp (!) 97.3 F (36.3 C) (Oral)   Resp 18   Ht 6' 1.75" (1.873 m)   Wt 257 lb (116.6 kg)   BMI 33.22 kg/m     Gen.-alert and oriented x3 in no apparent distress HEENT normal  for age Lungs no rhonchi or wheezing Cardiovascular regular rhythm no murmurs carotid pulses 3+ palpable no bruits audible Abdomen soft nontender no palpable masses Musculoskeletal free of  major deformities Skin clear -no rashes Neurologic normal Lower extremities 3+ femoral and dorsalis pedis pulses palpable bilaterally with no edema on the left 1+ edema right leg below the knee. No varicosities, hyperpigmentation, ulceration, or other evidence of chronic venous insufficiency on the right side.  Today I reviewed the previous ultrasound report from a few weeks ago performed [facility] and also performed a bedside sono site ultrasound exam independently on the right side. The great saphenous and small saphenous veins on the  right appeared normal with no evidence of gross reflux       Assessment:     Recent onset edema right lower extremity-etiology unknown No evidence of DVT or significant reflux and deep or superficial venous systems Bakers cyst noted in right popliteal fossa on ultrasound    Plan:     #10 interventions indicated #2 elevate foot of bed 3 inches #3 apply short leg elastic compression stockings first laying in the morning upon arising #4 no further recommendations

## 2016-10-04 ENCOUNTER — Other Ambulatory Visit: Payer: Self-pay | Admitting: Neurology

## 2016-10-04 NOTE — Telephone Encounter (Signed)
SCHEDULE OV BEFORE ADDITIONAL REFILLS

## 2016-10-21 ENCOUNTER — Telehealth: Payer: Self-pay | Admitting: Neurology

## 2016-10-21 NOTE — Telephone Encounter (Signed)
Pt daughter(on DPR) calling because pt's confusion and neck pain are getting worse.  Daughter states pt wife informed her Dr Jaynee Eagles mentioned some additional testing that could be done by another Doctor.  Daughter would like to proceed with that and is asking to be called

## 2016-10-25 NOTE — Telephone Encounter (Signed)
Called daughter Wells Guiles, LVM asking for return call.

## 2016-10-26 NOTE — Telephone Encounter (Signed)
Daughter Wells Guiles called back. She is concerned about her father's worsening memory (worse in evening) and neck pain leading to headache. She stated she didn't think he had a UTI but that her father was calling [facility] to check with them. I spoke with Dr. Jaynee Eagles, she offered to see pt in the office for a f/u. He will come on November 2nd @ 08:00, arrival time 07:30. I informed daughter that if pt has acute neurological changes/stroke symptoms to go to ED for evaluation. She verbalized understanding.

## 2016-10-26 NOTE — Telephone Encounter (Signed)
Pt's daughter returning Rn's call. Call transferred to RN

## 2016-11-04 ENCOUNTER — Ambulatory Visit (INDEPENDENT_AMBULATORY_CARE_PROVIDER_SITE_OTHER): Payer: Medicare Other | Admitting: Neurology

## 2016-11-04 ENCOUNTER — Encounter: Payer: Self-pay | Admitting: Neurology

## 2016-11-04 VITALS — BP 144/83 | HR 79 | Wt 253.0 lb

## 2016-11-04 DIAGNOSIS — M7918 Myalgia, other site: Secondary | ICD-10-CM

## 2016-11-04 DIAGNOSIS — G309 Alzheimer's disease, unspecified: Secondary | ICD-10-CM | POA: Diagnosis not present

## 2016-11-04 DIAGNOSIS — F0280 Dementia in other diseases classified elsewhere without behavioral disturbance: Secondary | ICD-10-CM

## 2016-11-04 DIAGNOSIS — R413 Other amnesia: Secondary | ICD-10-CM

## 2016-11-04 DIAGNOSIS — M542 Cervicalgia: Secondary | ICD-10-CM | POA: Diagnosis not present

## 2016-11-04 DIAGNOSIS — G8929 Other chronic pain: Secondary | ICD-10-CM

## 2016-11-04 MED ORDER — TIZANIDINE HCL 4 MG PO TABS: 4.0000 mg | ORAL_TABLET | Freq: Four times a day (QID) | ORAL | 6 refills | Status: DC | PRN

## 2016-11-04 NOTE — Progress Notes (Signed)
[facility]    Provider:  Dr Scott Ibarra Referring Provider: Josefa Ibarra* Primary Care Physician:  [facility]  CC: Memory changes  Interval history 11/04/2016: This is a 66 year old patient who is here for continued complaints of memory changes with absolutely normal neurocognitive testing and MRI of the brain. I do suspect that there is a mood disorder component and there is lots of stress in the home. However we do need to thoroughly investigate these memory complaints. Will repeat formal neurocognitive testing. Daughter and wife again here. At night he is "an asshole", character changes in the evening, he can;t remember anything, he is very short, bump into walls, memory changes, he feels there has been a change in memory and personality. He is not as fun as he used to be. He is "hit or miss" at night. Getting worse for months. He requests a    Neck pain: - Needs PT for dry needling, stretching, strengthening, cervical traction, massage/manual therapy, tens units - Dr Scott Ibarra for evaluation of trying botox aagain - Tizanidine at night - Heat  More neck pain, stiffness in the neck, progressive, hypertrophied neck muscles, decreased range of motion, elevated right shoulder, laterocollis right, torticollis to the right  Interval history 02/15/2016: Patient is here for evaluation of attention problems and perceived memory changes. Formal neuropsychiatric testing showed no evidence of dementia or mild cognitive impairment. But his neuropsychiatric testing did show decreased attention. He does suffer with some mood disorders in his family says he is very irritable. He tried Adderall to see if this helps his attention and perceived memory changes which it did mildly but it says it makes him feel strange. He can try Wellbutrin instead which may help with his mood as well. Discussed this today and discussed side effects to Wellbutrin.     Interval history 10/27/2015: Here for review of his neurocognitive testing which was overall very good without alzheimers. Sleep study was negative. 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   Neurocognitive testing: SUMMARY &IMPRESSION: [name] is a 66 year old, right-handed, married, Caucasian male with 13 years of education, who reported experiencing progressively worsening cognitive difficulties over the past 1-2 years. Onset was insidious with no precipitating factors endorsed.  Test results revealed average (if not above average to superior range) performance across most cognitive domains and thinking skills assessed during this evaluation with the exception of below expectation performances on tasks measuring certain executive cognitive processes (i.e., processing speed, complex attention, response inhibition). Memory problems were not found; in fact, learning and memory were a relative strength. Emotionally, Scott Ibarra endorsed experiencing mild bouts of intermittent depressive symptoms.   Scott Ibarra performance is consistent with a diagnosis of Mild Neurocognitive Disorder (i.e., mild cognitive impairment), predominantly dysexecutive. While an exact etiology is not entirely clear, these difficulties implicate frontal/subcortical system deficits that could be secondary to the small vessel disease as reportedly noted via neuroimaging, as while this is a common finding in older adults, it is not necessarily normal. I have no concerns for Alzheimer's disease.   Given these findings, the following recommendations are provided. Per request, a copy of this report will be mailed to the patient.   WUJ:WJXBJY Scott Holstonis a 66 y.o.malehere as a referral from Dr. Edwyna Ibarra memory changes. No significant PMHx. Memory changes started a year ago and slowly worsening with increase over the last 6 months. Wife and daughter noticed.  Patient just admitted. He forgets conversations, he can  remember songs from the 64s, more short-term memory changes. He can;t remember how to get to locations he has been going to for 22 years. He couldn;t remember how to get here today. He finds driving confusing. Misplaces objects. Wife has always done the finances. Recently he has more mood swings. Daughetr and wife go into their rooms because they can;t be around him. His computer froze up and he threw his hands up in the air. He got extremely frustrated. No hallucinations, no delusions. No new medications. Has been on flexeril prn for years no new medications. No head trauma. Unknown if work is noticing. Still can use a computer. Irritability. Forgets conversations quickly, blank stares like he never heard it. He had depression 10 years ago when he was having a lot of stress. Denies current mood disorders. leep well, does not wake up refreshed, he snores but not as heavily as he used to. Mother with dementia. Other extended family members on both sides with dementia. Mostly mother's side. She developed dementia in late 42s or early 84s.  Review of Systems: Patient complains of symptoms per HPI as well as the following symptoms: Irritability, inattention,. Pertinent negatives and positives per HPI. All others negative.   Social History   Social History  . Marital status: Married    Spouse name: Scott Ibarra  . Number of children: N/A  . Years of education: 24   Occupational History  . Catering manager- retired     Social History Main Topics  . Smoking status: Never Smoker  . Smokeless tobacco: Never Used  . Alcohol use Yes     Comment: "1 beer every once in awhile"  . Drug use: No  . Sexual activity: Not on file   Other Topics Concern  . Not on file   Social History Narrative   Lives at home with wife.   Caffeine use: 2-3 cups of coffee a day    Right-handed       Family History  Problem Relation Age of Onset  . Stroke  Mother   . Cancer Father     Past Medical History:  Diagnosis Date  . Meniscus degeneration    L knee; has had repair x 2 and injections  . Psoriasis   . Varicose veins     Past Surgical History:  Procedure Laterality Date  . CHOLECYSTECTOMY  2011  . ligation and stripping left  1994  . MENISCUS REPAIR    . VARICOSE VEIN SURGERY  2013    Current Outpatient Prescriptions  Medication Sig Dispense Refill  . mirabegron ER (MYRBETRIQ) 25 MG TB24 tablet Take 25 mg by mouth daily. For three weeks, followed by 50 mg daily thereafter 25 mg started on 11/03/16    . alfuzosin (UROXATRAL) 10 MG 24 hr tablet     . buPROPion (WELLBUTRIN XL) 150 MG 24 hr tablet START WITH 1 TABLET BY MOUTH IN THE MORNING. CAN INCREASE TO 2 TABS IN THE MORNING IN 1 WEEK 60 tablet 0  . clobetasol cream (TEMOVATE) 0.05 %     . cyclobenzaprine (FLEXERIL) 5 MG tablet Take 1 tablet (5 mg total) by mouth 3 (three) times daily as needed for muscle spasms. 30 tablet 12  . naproxen (NAPROSYN) 500 MG tablet Take 1 tablet (500 mg total) by mouth 2 (two) times daily as needed. Take with meals. Watch for GI upset or red or dark stools 60 tablet 3  . tiZANidine (ZANAFLEX) 4 MG tablet Take 1 tablet (4 mg  total) by mouth every 6 (six) hours as needed for muscle spasms. 90 tablet 6  . triamcinolone cream (KENALOG) 0.1 %      No current facility-administered medications for this visit.     Allergies as of 11/04/2016  . (No Known Allergies)    Vitals: BP (!) 144/83   Pulse 79   Wt 253 lb (114.8 kg)   BMI 32.70 kg/m  Last Weight:  Wt Readings from Last 1 Encounters:  11/04/16 253 lb (114.8 kg)   Last Height:   Ht Readings from Last 1 Encounters:  07/20/16 6' 1.75" (1.873 m)   MMSE - Mini Mental State Exam 11/04/2016 08/11/2015  Orientation to time 4 5  Orientation to Place 5 5  Registration 3 3  Attention/ Calculation 5 5  Recall 3 3  Language- name 2 objects 2 2  Language- repeat 1 1  Language- follow 3 step  command 3 3  Language- read & follow direction 1 1  Write a sentence 1 1  Copy design 1 1  Total score 51 30    Assessment/Plan:66 year old with memory changes a FHx of early-onset dementia in his mother. Neurocognitive testing which was overall very good without alzheimers. Sleep study was negative. Discussed his inattention, Tried Aderall and Wellbutrin which didn't help.    - He does suffer with some mood disorders and his family says he is very irritable, this may be a latge contributor to his compliants. If workup does not reveal neurocognitive disorder will encourage therapy and psychiatry especially due to extreme stress in the household with wife and daughter having chronic medical issues.    Chronic Neck pain: - Needs PT for dry needling, stretching, strengthening, cervical traction, massage/manual therapy, tens units - Dr Scott Ibarra for evaluation of trying botox aagain - Tizanidine at night - Heat  More neck pain, stiffness in the neck, progressive, hypertrophied neck muscles, decreased range of motion, elevated right shoulder, laterocollis right, torticollis to the right: Patient has cervical dystonia, progressive, has tried muscle relaxers, physical therapy, conservative measures for several years recommend Botox.  Orders Placed This Encounter  Procedures  . NM PET Metabolic Brain  . Ambulatory referral to Neuropsychology  . Ambulatory referral to Physical Therapy    Past workup:  MRi of the brain unremarkable for age TSH and B12 and RPR normal Discussed FDG Pet scan as well as our CREAD research trial. His cognition was too good to qualify.  Neurocognitive testing completed: No signs of dementia and not even mild cognitive impairment but impaired attention Epworth Sleepiness scale 13, snoring, memory loss: sleep evaluation: No OSA   Sarina Ill, MD  [facility] [address] [place], [place] [address]  Phone [phone] Fax  [phone]  A total of 25 minutes minutes was spent face-to-face with this patient. Over Ibarra this time was spent on counseling patient on the memory loss, chronic neck pain diagnosis and different diagnostic and therapeutic options available.

## 2016-11-04 NOTE — Patient Instructions (Addendum)
-   PT for dry needling, stretching, strengthening, cervical traction, massage/manual therapy, tens units - Dr Krista Blue for evaluation of trying botox aagain - Tizanidine at night - Heat - FDG PET Scan Dr. Bonita Quin

## 2016-11-06 ENCOUNTER — Telehealth: Payer: Self-pay | Admitting: Neurology

## 2016-11-06 NOTE — Telephone Encounter (Signed)
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   Scott Ibarra - he already has an appointment with Dr. Krista Blue but not sure you want to change him to a botox day for Dr. Krista Blue or change the appointment to reflect he is getting botox for cervical dystonia. Thanks

## 2016-11-07 NOTE — Telephone Encounter (Signed)
Form received, Sharyn Lull can he receive his injection on this day? If so I will change the apt type.

## 2016-11-07 NOTE — Telephone Encounter (Signed)
Botox consent form started and given to Dr. Jaynee Eagles for Dr. Krista Blue.

## 2016-11-10 NOTE — Telephone Encounter (Signed)
Noted, thank you

## 2016-11-15 ENCOUNTER — Ambulatory Visit: Payer: Medicare Other | Attending: Neurology | Admitting: Physical Therapy

## 2016-11-15 ENCOUNTER — Encounter: Payer: Self-pay | Admitting: Physical Therapy

## 2016-11-15 DIAGNOSIS — R252 Cramp and spasm: Secondary | ICD-10-CM | POA: Diagnosis present

## 2016-11-15 DIAGNOSIS — R293 Abnormal posture: Secondary | ICD-10-CM | POA: Insufficient documentation

## 2016-11-15 DIAGNOSIS — M6281 Muscle weakness (generalized): Secondary | ICD-10-CM | POA: Diagnosis present

## 2016-11-15 DIAGNOSIS — M542 Cervicalgia: Secondary | ICD-10-CM | POA: Insufficient documentation

## 2016-11-15 NOTE — Patient Instructions (Signed)
     Extensors, Supine   Lie supine, head on small, rolled towel. Gently tuck chin and bring toward chest. Hold _3__ seconds. Repeat __10_ times per session. Do _1-2__ sessions per day.

## 2016-11-15 NOTE — Therapy (Signed)
[facility] [address], [place], [place], [address] Phone: [phone]   Fax:  [phone]  Physical Therapy Evaluation  Patient Details  Name: Scott Ibarra MRN: [id number] Date of Birth: July 22, 1950 Referring Provider: Dr. Jaynee Eagles   Encounter Date: 11/15/2016  PT End of Session - 11/15/16 1359    Visit Number  1    Date for PT Re-Evaluation  01/10/17    Authorization Type  Medicare G codes;  KX at visit 15    PT Start Time  1230    PT Stop Time  1320    PT Time Calculation (min)  50 min    Activity Tolerance  Patient tolerated treatment well       Past Medical History:  Diagnosis Date  . Meniscus degeneration    L knee; has had repair x 2 and injections  . Psoriasis   . Varicose veins     Past Surgical History:  Procedure Laterality Date  . CHOLECYSTECTOMY  2011  . ligation and stripping left  1994  . [place] SURGERY  2013    There were no vitals filed for this visit.   Subjective Assessment - 11/15/16 1237    Subjective  Bilateral neck pain since years ago for no apparent reason.  Both arms tingle to the elbow.  Worsening and makes me feel "spacey".  Pain is constant;  Sometimes I bump into walls.  States he has swelling or spasm.      Patient is accompained by:  Family member wife in W/C recent CVA    Pertinent History  dementia;  had injection ESI and recent Botox which didn't help.  Going to [facility] for more potential injections in Dec.    Limitations  House hold activities    Diagnostic tests  MRI 'This didn't show up in the MRI"      Patient Stated Goals  feeling a lot better, no have numbness    Currently in Pain?  Yes    Pain Score  7     Pain Orientation  Right;Left    Pain Type  Chronic pain    Pain Frequency  Constant    Aggravating Factors   no patterns at all    Pain Relieving Factors  Flexeril, Naproxen, lying down         [facility] PT Assessment  - 11/15/16 0001      Assessment   Medical Diagnosis  neck pain, myofascial pain    Referring Provider  Dr. Jaynee Eagles    Onset Date/Surgical Date  -- 13 years ago    Hand Dominance  Right    Next MD Visit  3 months    Prior Therapy  1 1/2 years ago but job changed  didn't help, massage, stretch, some stuff with bands      Precautions   Precautions  None;Fall    Precaution Comments  no falls but patient reports "he runs into walls sometimes"      Restrictions   Weight Bearing Restrictions  No      Balance Screen   Has the patient fallen in the past 6 months  No    Has the patient had a decrease in activity level because of a fear of falling?   Yes    Is the patient reluctant to leave their home because of a fear of falling?   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      Observation/Other Assessments   Focus on Therapeutic Outcomes (FOTO)   49% limitation      Posture/Postural Control   Posture/Postural Control  Postural limitations    Postural Limitations  Rounded Shoulders;Forward head    Posture Comments  right scalene and SCM more prominent on right      AROM   AROM Assessment Site  -- UE ROM WFLS    Cervical Flexion  60    Cervical Extension  40    Cervical - Right Side Bend  35    Cervical - Left Side Bend  30    Cervical - Right Rotation  50    Cervical - Left Rotation  40      Strength   Strength Assessment Site  -- symmetrical bilaterally proximal and distal    Right Shoulder Flexion  4/5    Right Shoulder Extension  4/5    Right Shoulder ABduction  4/5    Right Shoulder Internal Rotation  4/5    Right Shoulder External Rotation  4/5    Left Shoulder Flexion  4/5    Left Shoulder  Extension  4/5    Left Shoulder ABduction  4/5    Left Shoulder Internal Rotation  4/5    Left Shoulder External Rotation  4/5    Cervical Flexion  4/5    Cervical Extension  4-/5      Palpation   Palpation comment  multiple tender points bil upper traps, suboccipitals, paraspinals, levators      Distraction Test   Findngs  Positive      other    Findings  Positive    Side  Right    Comment  increased tingling with neural tension             Objective measurements completed on examination: See above findings.              PT Education - 11/15/16 1358    Education provided  Yes    Education Details  info on dry needling, postural correction; supine cervical retractions; use of home TENS    Person(s) Educated  Patient    Methods  Explanation;Handout    Comprehension  Verbalized understanding;Returned demonstration       PT Short Term Goals - 11/15/16 1432      PT SHORT TERM GOAL #1   Title  The patient will demonstrate awareness of proper posture, basic HEP and self care strategies    Time  4    Period  Weeks    Status  New    Target Date  12/13/16      PT SHORT TERM GOAL #2   Title  The patient will report a 25% reduction in neck and headache pain with usual ADLs    Time  4    Period  Weeks    Status  New      PT SHORT TERM GOAL #3   Title  Improved left sidebending to 35 degrees and left rotation to 45 degrees needed for driving    Time  4  Period  Weeks    Status  New        PT Long Term Goals - 11/15/16 1434      PT LONG TERM GOAL #1   Title  The patient will return demo HEP for neck Flexibility, strengthening and posture re-education.    Time  8    Period  Weeks    Status  New    Target Date  01/10/17      PT LONG TERM GOAL #2   Title  The patient will report decreased pain and spasm by 50% with home and work ADLs    Time  8    Period  Weeks    Status  New      PT LONG TERM GOAL #3   Title  The patient will improve neck  rotation  to 50 degrees needed for driving    Time  8    Period  Weeks    Status  New      PT LONG TERM GOAL #4   Title  The patient will have improved shoulder, scapular and cervical strength grossly 4+/5 needed for the care of his wife and home/work ADLs    Time  8    Period  Weeks    Status  New      PT LONG TERM GOAL #5   Title  FOTO functional outcome score improved from 49% limitation to 38% indicating improved function with less pain    Time  8    Period  Weeks    Status  New             Plan - 11/15/16 1411    Clinical Impression Statement  The patient has a 13 year history of neck pain that originally was in the right side of his neck but has worsened to be in both sides of his neck, numbness and tingling in both upper arms and headaches.   He reports there are no specific triggers for his pain but a sudden "swelling" on the side of his neck (along upper trapezius muscle) with relief only with lying down and going to sleep.  He has not responded to previous spinal injections, Botox injections or PT 1 1/2 years ago.  He has limited cervical ROM particularly left sidebending and left rotation.  UE ROM is WFLS.  UE strength grossly 4/5 bilaterally and grip strength is symmetrical.  He has moderate head forward and rounded shoulders sitting posture.  Tender points in bil upper traps, levators, and suboccipitals.  Increased UE numbness and tingling with neural stress position.  He would benefit from PT to address these deficits.      History and Personal Factors relevant to plan of care:  dementia;  patient is helping take care of his wife who recently had a stroke and in a wheelchair;  he works at night in order to assist his wife during the day;  chronic pain;  multi body systems affected    Clinical Presentation  Evolving    Clinical Presentation due to:   worsening over time    Clinical Decision Making  Moderate    Rehab Potential  Good    Clinical Impairments Affecting Rehab  Potential  decreased memory    PT Frequency  2x / week    PT Duration  8 weeks    PT Treatment/Interventions  ADLs/Self Care Home Management;Electrical Stimulation;Cryotherapy;Traction;Moist Heat;Ultrasound;Therapeutic exercise;Therapeutic activities;Patient/family education;Neuromuscular re-education;Manual techniques;Taping;Dry needling    PT Next Visit Plan  DN to bil upper traps, levators, scalenes,  multifidi, suboccipitals;  add stretches to HEP;  cervical traction (perhaps alternating appointment with DN);  moist heat    Recommended Other Services  has a home TENS unit     Consulted and Agree with Plan of Care  Patient;Family member/caregiver    Family Member Consulted  wife present for eval and scheduling        Patient will benefit from skilled therapeutic intervention in order to improve the following deficits and impairments:  Increased fascial restricitons, Pain, Postural dysfunction, Increased muscle spasms, Decreased range of motion, Decreased strength  Visit Diagnosis: Cervicalgia - Plan: PT plan of care cert/re-cert  Muscle weakness (generalized) - Plan: PT plan of care cert/re-cert  Cramp and spasm - Plan: PT plan of care cert/re-cert  G-Codes - 09/81/19 1440    Functional Assessment Tool Used (Outpatient Only)  FOTO; clinical judgement     Functional Limitation  Changing and maintaining body position    Changing and Maintaining Body Position Current Status (J4782)  At least 40 percent but less than 60 percent impaired, limited or restricted    Changing and Maintaining Body Position Goal Status (N5621)  At least 20 percent but less than 40 percent impaired, limited or restricted        Problem List Patient Active Problem List   Diagnosis Date Noted  . Leg edema, right 07/20/2016  . 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 Varicose veins of lower extremities with other complications 30/86/5784   Ruben Im, PT 11/15/16 2:43 PM Phone: [phone] Fax: [phone]  Alvera Singh 11/15/2016, 2:42 PM  [facility] [address], [place], [place], [address] Phone: [phone]   Fax:  [phone]  Name: Scott Ibarra MRN: [id number] Date of Birth: [birth year]

## 2016-11-16 ENCOUNTER — Ambulatory Visit: Payer: Medicare Other

## 2016-11-16 DIAGNOSIS — M542 Cervicalgia: Secondary | ICD-10-CM

## 2016-11-16 DIAGNOSIS — R252 Cramp and spasm: Secondary | ICD-10-CM

## 2016-11-16 DIAGNOSIS — M6281 Muscle weakness (generalized): Secondary | ICD-10-CM

## 2016-11-16 DIAGNOSIS — R293 Abnormal posture: Secondary | ICD-10-CM

## 2016-11-16 NOTE — Therapy (Signed)
[facility] [address] [place], [place], [address] Phone: [phone]   Fax:  [phone]  Physical Therapy Treatment  Patient Details  Name: Scott Ibarra MRN: [id number] Date of Birth: 1950-08-22 Referring Provider: Dr. Jaynee Eagles   Encounter Date: 11/16/2016  PT End of Session - 11/16/16 1223    Visit Number  2    Date for PT Re-Evaluation  01/10/17    Authorization Type  Medicare G codes;  KX at visit 15    PT Start Time  1146 dry needling    PT Stop Time  1233    PT Time Calculation (min)  47 min    Activity Tolerance  Patient tolerated treatment well    Behavior During Therapy  [facility] for tasks assessed/performed       Past Medical History:  Diagnosis Date  . Meniscus degeneration    L knee; has had repair x 2 and injections  . Psoriasis   . Varicose veins     Past Surgical History:  Procedure Laterality Date  . CHOLECYSTECTOMY  2011  . ligation and stripping left  1994  . [place] SURGERY  2013    There were no vitals filed for this visit.  Subjective Assessment - 11/16/16 1145    Subjective  Pt is ready to try dry needling.      Currently in Pain?  Yes    Pain Score  6     Pain Location  Neck    Pain Orientation  Right;Left    Pain Descriptors / Indicators  Tightness;Aching                      [facility] Adult PT Treatment/Exercise - 11/16/16 0001      Modalities   Modalities  Moist Heat      Moist Heat Therapy   Number Minutes Moist Heat  15 Minutes    Moist Heat Location  Cervical      Manual Therapy   Manual Therapy  Soft tissue mobilization;Myofascial release    Manual therapy comments  soft tissue mobilization and trigger point release to bil upper traps, subooccipitals and cervical paraspinals       Trigger Point Dry Needling - 11/16/16 1151    Consent Given?  Yes    Muscles Treated Upper Body  Upper trapezius;Suboccipitals muscle  group;Oblique capitus    Upper Trapezius Response  Twitch reponse elicited bil.  and bil cervical multifidi    Oblique Capitus Response  Twitch response elicited;Palpable increased muscle length    SubOccipitals Response  Twitch response elicited;Palpable increased muscle length           PT Education - 11/15/16 1358    Education provided  Yes    Education Details  info on dry needling, postural correction; supine cervical retractions; use of home TENS    Person(s) Educated  Patient    Methods  Explanation;Handout    Comprehension  Verbalized understanding;Returned demonstration       PT Short Term Goals - 11/15/16 1432      PT SHORT TERM GOAL #1   Title  The patient will demonstrate awareness of proper posture, basic HEP and self care strategies    Time  4    Period  Weeks    Status  New    Target Date  12/13/16      PT SHORT TERM GOAL #2   Title  The  patient will report a 25% reduction in neck and headache pain with usual ADLs    Time  4    Period  Weeks    Status  New      PT SHORT TERM GOAL #3   Title  Improved left sidebending to 35 degrees and left rotation to 45 degrees needed for driving    Time  4    Period  Weeks    Status  New        PT Long Term Goals - 11-17-2016 1434      PT LONG TERM GOAL #1   Title  The patient will return demo HEP for neck Flexibility, strengthening and posture re-education.    Time  8    Period  Weeks    Status  New    Target Date  01/10/17      PT LONG TERM GOAL #2   Title  The patient will report decreased pain and spasm by 50% with home and work ADLs    Time  8    Period  Weeks    Status  New      PT LONG TERM GOAL #3   Title  The patient will improve neck rotation  to 50 degrees needed for driving    Time  8    Period  Weeks    Status  New      PT LONG TERM GOAL #4   Title  The patient will have improved shoulder, scapular and cervical strength grossly 4+/5 needed for the care of his wife and home/work ADLs     Time  8    Period  Weeks    Status  New      PT LONG TERM GOAL #5   Title  FOTO functional outcome score improved from 49% limitation to 38% indicating improved function with less pain    Time  8    Period  Weeks    Status  New            Plan - 11/16/16 1220    Clinical Impression Statement  Pt with first session after evaluation yesterday.  Session focused on dry needling to bil upper traps, neck and suboccipitals.  Pt demonstrated improved tissue mobiltiy after dry needling and manual therapy today.  Pt with increased tension on the Rt vs the Lt.  Pt verbalized understanding of importance of postural corrections and frequent cervical flexibility.  Pt will benefit from continued skilled PT for manual therapy to address chronic muscle tension, postural strength and flexibility.      Rehab Potential  Good    Clinical Impairments Affecting Rehab Potential  decreased memory    PT Frequency  2x / week    PT Duration  8 weeks    PT Treatment/Interventions  ADLs/Self Care Home Management;Electrical Stimulation;Cryotherapy;Traction;Moist Heat;Ultrasound;Therapeutic exercise;Therapeutic activities;Patient/family education;Neuromuscular re-education;Manual techniques;Taping;Dry needling    PT Next Visit Plan  assess response to dry needling, needling next week if helpful, cervical flexibility, cervical traction    Consulted and Agree with Plan of Care  Patient;Family member/caregiver    Family Member Consulted  wife present for treatment       Patient will benefit from skilled therapeutic intervention in order to improve the following deficits and impairments:  Increased fascial restricitons, Pain, Postural dysfunction, Increased muscle spasms, Decreased range of motion, Decreased strength  Visit Diagnosis: Cervicalgia  Muscle weakness (generalized)  Cramp and spasm  Abnormal posture   G-Codes - 11/17/2016 1440  Functional Assessment Tool Used (Outpatient Only)  FOTO; clinical  judgement     Functional Limitation  Changing and maintaining body position    Changing and Maintaining Body Position Current Status [phone])  At least 40 percent but less than 60 percent impaired, limited or restricted    Changing and Maintaining Body Position Goal Status (C6237)  At least 20 percent but less than 40 percent impaired, limited or restricted       Problem List Patient Active Problem List   Diagnosis Date Noted  . Leg edema, right 07/20/2016  . 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 Varicose veins of lower extremities with other complications 62/83/1517    Scott Ibarra, PT 11/16/16 12:25 PM  [facility] [address], [place], [place], [address] Phone: [phone]   Fax:  [phone]  Name: DERONTE SOLIS MRN: [id number] Date of Birth: [birth year]

## 2016-11-17 ENCOUNTER — Other Ambulatory Visit: Payer: Self-pay | Admitting: Neurology

## 2016-11-22 ENCOUNTER — Telehealth: Payer: Self-pay | Admitting: Neurology

## 2016-11-22 ENCOUNTER — Encounter: Payer: Self-pay | Admitting: Psychology

## 2016-11-22 NOTE — Telephone Encounter (Signed)
Scott Ibarra, I ordered an FDG PET scan for dementia would you make sure it gets scheduled? Thanks!

## 2016-11-22 NOTE — Telephone Encounter (Signed)
Called and spoke with pt's daughter Scott Ibarra. She wants Dr. Jaynee Eagles to know that the appt for formal testing w/ Dr. Richrd Sox will not be until March. She also stated that the PET scan has not been scheduled yet; she is waiting on a call back from them.

## 2016-11-22 NOTE — Telephone Encounter (Signed)
Pt's daughter Wells Guiles [facility]) [phone] called she has neuro testing schedule for 03/09/17 with Dr Richrd Sox. Please call to advise.

## 2016-11-23 NOTE — Telephone Encounter (Signed)
I have sent Scott Ibarra a message to schedule PET Scan. [name] [phone]. Scott Ibarra will call patient to schedule. Thanks Hinton Dyer.

## 2016-11-23 NOTE — Telephone Encounter (Signed)
Please let patient know so he calls [place] if it doesn't get completed by next week thanks!

## 2016-11-28 ENCOUNTER — Ambulatory Visit: Payer: Medicare Other

## 2016-11-28 DIAGNOSIS — M6281 Muscle weakness (generalized): Secondary | ICD-10-CM

## 2016-11-28 DIAGNOSIS — M542 Cervicalgia: Secondary | ICD-10-CM | POA: Diagnosis not present

## 2016-11-28 DIAGNOSIS — R252 Cramp and spasm: Secondary | ICD-10-CM

## 2016-11-28 DIAGNOSIS — R293 Abnormal posture: Secondary | ICD-10-CM

## 2016-11-28 NOTE — Therapy (Signed)
[facility] [address] [place], [place], [address] Phone: [phone]   Fax:  [phone]  Physical Therapy Treatment  Patient Details  Name: Scott Ibarra MRN: [id number] Date of Birth: Dec 14, 1950 Referring Provider: Dr. Jaynee Eagles   Encounter Date: 11/28/2016  PT End of Session - 11/28/16 1142    Visit Number  3    Number of Visits  10    Date for PT Re-Evaluation  01/10/17    Authorization Type  Medicare G codes;  KX at visit 15    PT Start Time  1109 late and dry needling    PT Stop Time  1152    PT Time Calculation (min)  43 min    Activity Tolerance  Patient tolerated treatment well    Behavior During Therapy  [facility] for tasks assessed/performed       Past Medical History:  Diagnosis Date  . Meniscus degeneration    L knee; has had repair x 2 and injections  . Psoriasis   . Varicose veins     Past Surgical History:  Procedure Laterality Date  . CHOLECYSTECTOMY  2011  . ligation and stripping left  1994  . [place] SURGERY  2013    There were no vitals filed for this visit.  Subjective Assessment - 11/28/16 1109    Subjective  I felt a lot looser and had less pain after dry needling last session.      Currently in Pain?  Yes    Pain Score  4     Pain Location  Neck    Pain Descriptors / Indicators  Tightness;Aching    Pain Onset  More than a month ago    Pain Frequency  Constant    Aggravating Factors   no pattern    Pain Relieving Factors  Flexeril, dry needling, heat, lying down                      [facility] Adult PT Treatment/Exercise - 11/28/16 0001      Modalities   Modalities  Moist Heat      Moist Heat Therapy   Number Minutes Moist Heat  12 Minutes    Moist Heat Location  Cervical      Manual Therapy   Manual Therapy  Soft tissue mobilization;Myofascial release    Manual therapy comments  soft tissue mobilization and trigger point release  to bil upper traps, subooccipitals and cervical paraspinals       Trigger Point Dry Needling - 11/28/16 1106    Consent Given?  Yes    Muscles Treated Upper Body  Upper trapezius;Oblique capitus;Suboccipitals muscle group    Upper Trapezius Response  Twitch reponse elicited;Palpable increased muscle length    Oblique Capitus Response  Twitch response elicited;Palpable increased muscle length    SubOccipitals Response  Twitch response elicited;Palpable increased muscle length             PT Short Term Goals - 11/15/16 1432      PT SHORT TERM GOAL #1   Title  The patient will demonstrate awareness of proper posture, basic HEP and self care strategies    Time  4    Period  Weeks    Status  New    Target Date  12/13/16      PT SHORT TERM GOAL #2   Title  The patient will report a 25% reduction in neck and  headache pain with usual ADLs    Time  4    Period  Weeks    Status  New      PT SHORT TERM GOAL #3   Title  Improved left sidebending to 35 degrees and left rotation to 45 degrees needed for driving    Time  4    Period  Weeks    Status  New        PT Long Term Goals - 11/15/16 1434      PT LONG TERM GOAL #1   Title  The patient will return demo HEP for neck Flexibility, strengthening and posture re-education.    Time  8    Period  Weeks    Status  New    Target Date  01/10/17      PT LONG TERM GOAL #2   Title  The patient will report decreased pain and spasm by 50% with home and work ADLs    Time  8    Period  Weeks    Status  New      PT LONG TERM GOAL #3   Title  The patient will improve neck rotation  to 50 degrees needed for driving    Time  8    Period  Weeks    Status  New      PT LONG TERM GOAL #4   Title  The patient will have improved shoulder, scapular and cervical strength grossly 4+/5 needed for the care of his wife and home/work ADLs    Time  8    Period  Weeks    Status  New      PT LONG TERM GOAL #5   Title  FOTO functional outcome  score improved from 49% limitation to 38% indicating improved function with less pain    Time  8    Period  Weeks    Status  New            Plan - 11/28/16 1136    Clinical Impression Statement  Pt with reduced pain after first dry needling session last session.  Session today focused on dry needling to bil upper traps, neck and subocciptals.  Pt with significant improvement in tissue mobility today.  Pt with continued bil upper trap tension and suboccipital tenderness.  Pt verbalized postural corrections at home and is doing neck stretches.  Pt will continue to benefit from skilled PT for manual therapy, postural strength and cervical flexibility.    Rehab Potential  Good    Clinical Impairments Affecting Rehab Potential  decreased memory    PT Frequency  2x / week    PT Duration  8 weeks    PT Treatment/Interventions  ADLs/Self Care Home Management;Electrical Stimulation;Cryotherapy;Traction;Moist Heat;Ultrasound;Therapeutic exercise;Therapeutic activities;Patient/family education;Neuromuscular re-education;Manual techniques;Taping;Dry needling    PT Next Visit Plan  assess response to dry needling, cervical/postural stabilization, cervical traction    Consulted and Agree with Plan of Care  Patient;Family member/caregiver    Family Member Consulted  wife present for treatment       Patient will benefit from skilled therapeutic intervention in order to improve the following deficits and impairments:  Increased fascial restricitons, Pain, Postural dysfunction, Increased muscle spasms, Decreased range of motion, Decreased strength  Visit Diagnosis: Cervicalgia  Muscle weakness (generalized)  Cramp and spasm  Abnormal posture     Problem List Patient Active Problem List   Diagnosis Date Noted  . Leg edema, right 07/20/2016  . ADD (attention deficit  disorder) without hyperactivity 10/28/2015  . 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 Varicose veins of lower extremities with  other complications 68/08/8108    Zipporah Finamore 11/28/2016, 11:43 AM  [facility] [address], [place], [place], [address] Phone: [phone]   Fax:  [phone]  Name: Scott Ibarra MRN: [id number] Date of Birth: 1950/03/27

## 2016-11-29 NOTE — Telephone Encounter (Signed)
Patient was aware he is scheduled and called buy [facility] Patient is scheduled with Elvina Sidle 12/08/2016 .

## 2016-12-05 ENCOUNTER — Ambulatory Visit: Payer: Medicare Other

## 2016-12-08 ENCOUNTER — Ambulatory Visit: Payer: Medicare Other | Attending: Neurology

## 2016-12-08 ENCOUNTER — Ambulatory Visit: Payer: Self-pay | Admitting: Neurology

## 2016-12-08 ENCOUNTER — Ambulatory Visit (HOSPITAL_COMMUNITY)
Admission: RE | Admit: 2016-12-08 | Discharge: 2016-12-08 | Disposition: A | Discharge status: Home / Self Care | Payer: Medicare Other | Source: Ambulatory Visit | Attending: Neurology | Admitting: Neurology

## 2016-12-08 DIAGNOSIS — R252 Cramp and spasm: Secondary | ICD-10-CM | POA: Diagnosis present

## 2016-12-08 DIAGNOSIS — R293 Abnormal posture: Secondary | ICD-10-CM | POA: Diagnosis present

## 2016-12-08 DIAGNOSIS — G309 Alzheimer's disease, unspecified: Secondary | ICD-10-CM | POA: Diagnosis present

## 2016-12-08 DIAGNOSIS — R413 Other amnesia: Secondary | ICD-10-CM | POA: Insufficient documentation

## 2016-12-08 DIAGNOSIS — M542 Cervicalgia: Secondary | ICD-10-CM | POA: Insufficient documentation

## 2016-12-08 DIAGNOSIS — M6281 Muscle weakness (generalized): Secondary | ICD-10-CM | POA: Insufficient documentation

## 2016-12-08 DIAGNOSIS — F0280 Dementia in other diseases classified elsewhere without behavioral disturbance: Secondary | ICD-10-CM | POA: Insufficient documentation

## 2016-12-08 MED ORDER — FLUDEOXYGLUCOSE F - 18 (FDG) INJECTION
10.4000 | Freq: Once | INTRAVENOUS | Status: AC | PRN
  Administered 2016-12-08: 10.4 via INTRAVENOUS

## 2016-12-08 NOTE — Patient Instructions (Addendum)
   Scapular Retraction: Elbow Flexion (Standing)   With elbows bent to 90, pinch shoulder blades together and rotate arms out, keeping elbows bent. Repeat _10___ times per set. Do _1___ sets per session. Do many____ sessions per day.    KEEP HEAD IN NEUTRAL AND SHOULDERS DOWN AND RELAXED   Hold tubing in right hand, arm forward. Pull arm back, elbow straight. Repeat __10__ times per set. Do __2__ sets per session. Do _1-2___ sessions per day.  Copyright  VHI. All rights reserved.     With resistive band anchored in door, grasp both ends. Keeping elbows bent, pull back, squeezing shoulder blades together. Hold _3__ seconds. Repeat _2x10___ times. Do _1-2___ sessions per day.  [email or web address].[email or web address]   [facility] [address] [place], [place] [address] Phone # [phone] Fax [phone]

## 2016-12-08 NOTE — Therapy (Addendum)
[facility] [address] [place], [place], [address] Phone: [phone]   Fax:  [phone]  Physical Therapy Treatment  Patient Details  Name: Scott Ibarra MRN: [id number] Date of Birth: 10-31-1950 Referring Provider: Dr. Jaynee Eagles   Encounter Date: 12/08/2016  PT End of Session - 12/08/16 1119    Visit Number  4    Number of Visits  10    Date for PT Re-Evaluation  01/10/17    Authorization Type  Medicare G codes;  KX at visit 15    PT Start Time  1103    PT Stop Time  1156    PT Time Calculation (min)  53 min    Activity Tolerance  Patient tolerated treatment well    Behavior During Therapy  [facility] for tasks assessed/performed       Past Medical History:  Diagnosis Date  . Meniscus degeneration    L knee; has had repair x 2 and injections  . Psoriasis   . Varicose veins     Past Surgical History:  Procedure Laterality Date  . CHOLECYSTECTOMY  2011  . ligation and stripping left  1994  . [place] SURGERY  2013    There were no vitals filed for this visit.  Subjective Assessment - 12/08/16 1106    Subjective  I am feeling overall better.  40% overall improvement.      Pertinent History  dementia;  had injection ESI and recent Botox which didn't help.  Going to [employer] [facility] for more potential injections in Dec.    Currently in Pain?  Yes    Pain Score  4     Pain Location  Neck    Pain Orientation  Left;Right    Pain Descriptors / Indicators  Tightness;Aching    Pain Type  Chronic pain    Pain Onset  More than a month ago    Pain Frequency  Constant    Aggravating Factors   no pattern     Pain Relieving Factors  heat, lying down, Flexeril, dry needling                      [facility] Adult PT Treatment/Exercise - 12/08/16 0001      Exercises   Exercises  Shoulder;Neck      Neck Exercises: Machines for Strengthening   UBE (Upper Arm Bike)  LEvel 1x 6  minutes (3/3)      Neck Exercises: Theraband   Shoulder Extension  Green;20 reps    Rows  Green;20 reps    Shoulder External Rotation  20 reps;Green      Modalities   Modalities  Moist Heat      Moist Heat Therapy   Number Minutes Moist Heat  12 Minutes    Moist Heat Location  Cervical      Manual Therapy   Manual Therapy  Soft tissue mobilization;Myofascial release    Manual therapy comments  soft tissue mobilization and trigger point release to bil upper traps, subooccipitals and cervical paraspinals       Trigger Point Dry Needling - 12/08/16 1109    Consent Given?  Yes    Muscles Treated Upper Body  Upper trapezius;Oblique capitus;Suboccipitals muscle group    Upper Trapezius Response  Twitch reponse elicited;Palpable increased muscle length    Oblique Capitus Response  Twitch response elicited;Palpable increased muscle length    SubOccipitals Response  Twitch response elicited;Palpable  increased muscle length           PT Education - 12/08/16 1119    Education provided  Yes    Education Details  scapular strength with green theraband    Person(s) Educated  Patient    Methods  Explanation;Handout    Comprehension  Verbalized understanding;Returned demonstration       PT Short Term Goals - 11/15/16 1432      PT SHORT TERM GOAL #1   Title  The patient will demonstrate awareness of proper posture, basic HEP and self care strategies    Time  4    Period  Weeks    Status  New    Target Date  12/13/16      PT SHORT TERM GOAL #2   Title  The patient will report a 25% reduction in neck and headache pain with usual ADLs    Time  4    Period  Weeks    Status  New      PT SHORT TERM GOAL #3   Title  Improved left sidebending to 35 degrees and left rotation to 45 degrees needed for driving    Time  4    Period  Weeks    Status  New        PT Long Term Goals - 11/15/16 1434      PT LONG TERM GOAL #1   Title  The patient will return demo HEP for neck  Flexibility, strengthening and posture re-education.    Time  8    Period  Weeks    Status  New    Target Date  01/10/17      PT LONG TERM GOAL #2   Title  The patient will report decreased pain and spasm by 50% with home and work ADLs    Time  8    Period  Weeks    Status  New      PT LONG TERM GOAL #3   Title  The patient will improve neck rotation  to 50 degrees needed for driving    Time  8    Period  Weeks    Status  New      PT LONG TERM GOAL #4   Title  The patient will have improved shoulder, scapular and cervical strength grossly 4+/5 needed for the care of his wife and home/work ADLs    Time  8    Period  Weeks    Status  New      PT LONG TERM GOAL #5   Title  FOTO functional outcome score improved from 49% limitation to 38% indicating improved function with less pain    Time  8    Period  Weeks    Status  New            Plan - 12/08/16 1237    Clinical Impression Statement  PT initiated postural strength exercises for HEP today.  Pt reports 40% overall improvement in neck pain since the start of care.  Pt with reduced tension in bil neck muscles today as noted during dry needling today.  Pt with reduced pain and tension after dry needling and manual therapy today.  Pt will continue to benefit from skilled PT for manual therapy, flexibility and strength.      Clinical Impairments Affecting Rehab Potential  decreased memory    PT Frequency  2x / week    PT Duration  8 weeks  PT Treatment/Interventions  ADLs/Self Care Home Management;Electrical Stimulation;Cryotherapy;Traction;Moist Heat;Ultrasound;Therapeutic exercise;Therapeutic activities;Patient/family education;Neuromuscular re-education;Manual techniques;Taping;Dry needling    PT Next Visit Plan  assess response to dry needling, cervical/postural stabilization, cervical traction    Recommended Other Services  initial certification is signed    Consulted and Agree with Plan of Care  Patient        Patient will benefit from skilled therapeutic intervention in order to improve the following deficits and impairments:  Increased fascial restricitons, Pain, Postural dysfunction, Increased muscle spasms, Decreased range of motion, Decreased strength  Visit Diagnosis: Cervicalgia  Muscle weakness (generalized)  Cramp and spasm  Abnormal posture     Problem List Patient Active Problem List   Diagnosis Date Noted  . Leg edema, right 07/20/2016  . 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 Varicose veins of lower extremities with other complications 38/88/2800   Scott Ibarra, PT 12/08/16 12:44 PM  [facility] [address], [place], [place], [address] Phone: [phone]   Fax:  [phone]  Name: RAQUAN IANNONE MRN: [id number] Date of Birth: 28-Nov-1950

## 2016-12-09 ENCOUNTER — Telehealth: Payer: Self-pay

## 2016-12-09 NOTE — Telephone Encounter (Signed)
Notes recorded by Marval Regal, RN on 12/09/2016 at 1:18 PM EST Call home number twice it would ring once, and cut off.

## 2016-12-14 NOTE — Telephone Encounter (Signed)
Rn call patient that PET scan is normal. Per Dr. Jaynee Eagles there is no indication of Alzheimer's or other type of dementia. PT verbalized understanding. ------

## 2016-12-15 ENCOUNTER — Ambulatory Visit: Payer: Medicare Other

## 2016-12-15 DIAGNOSIS — M542 Cervicalgia: Secondary | ICD-10-CM | POA: Diagnosis not present

## 2016-12-15 DIAGNOSIS — R252 Cramp and spasm: Secondary | ICD-10-CM

## 2016-12-15 DIAGNOSIS — R293 Abnormal posture: Secondary | ICD-10-CM

## 2016-12-15 DIAGNOSIS — M6281 Muscle weakness (generalized): Secondary | ICD-10-CM

## 2016-12-15 NOTE — Therapy (Signed)
[facility] [address], [place], [place], [address] Phone: [phone]   Fax:  [phone]  Physical Therapy Treatment  Patient Details  Name: Scott Ibarra MRN: [id number] Date of Birth: [birth year] Referring Provider: Dr. Jaynee Eagles   Encounter Date: 12/15/2016  PT End of Session - 12/15/16 1223    Visit Number  5    Number of Visits  10    Date for PT Re-Evaluation  01/10/17    Authorization Type  Medicare G codes;  KX at visit 15    PT Start Time  1146 dry needling    PT Stop Time  5993    PT Time Calculation (min)  49 min    Activity Tolerance  Patient tolerated treatment well    Behavior During Therapy  [facility] for tasks assessed/performed       Past Medical History:  Diagnosis Date  . Meniscus degeneration    L knee; has had repair x 2 and injections  . Psoriasis   . Varicose veins     Past Surgical History:  Procedure Laterality Date  . CHOLECYSTECTOMY  2011  . ligation and stripping left  1994  . [place] SURGERY  2013    There were no vitals filed for this visit.  Subjective Assessment - 12/15/16 1145    Subjective  I still feel 40% better.  My neck was stiff yesterday.      Pertinent History  dementia;  had injection ESI and recent Botox which didn't help.  Going to [employer] for more potential injections in Dec.    Currently in Pain?  Yes    Pain Score  4     Pain Location  Neck    Pain Orientation  Left;Right    Pain Descriptors / Indicators  Tightness;Aching    Pain Type  Chronic pain    Pain Onset  More than a month ago    Pain Frequency  Constant    Aggravating Factors   no pattern    Pain Relieving Factors  heat, lying down, Flexeril, dry needling                      [facility] Adult PT Treatment/Exercise - 12/15/16 0001      Neck Exercises: Theraband   Shoulder Extension  Green;20 reps    Rows  Green;20 reps    Shoulder External  Rotation  20 reps;Green      Modalities   Modalities  Moist Heat      Moist Heat Therapy   Number Minutes Moist Heat  12 Minutes    Moist Heat Location  Cervical      Manual Therapy   Manual Therapy  Soft tissue mobilization;Myofascial release    Manual therapy comments  soft tissue mobilization and trigger point release to bil upper traps, subooccipitals and cervical paraspinals       Trigger Point Dry Needling - 12/15/16 1159    Consent Given?  Yes    Muscles Treated Upper Body  Upper trapezius;Oblique capitus;Suboccipitals muscle group    Upper Trapezius Response  Twitch reponse elicited;Palpable increased muscle length    Oblique Capitus Response  Twitch response elicited;Palpable increased muscle length    SubOccipitals Response  Twitch response elicited;Palpable increased muscle length             PT Short Term Goals - 11/15/16 1432      PT SHORT TERM  GOAL #1   Title  The patient will demonstrate awareness of proper posture, basic HEP and self care strategies    Time  4    Period  Weeks    Status  New    Target Date  12/13/16      PT SHORT TERM GOAL #2   Title  The patient will report a 25% reduction in neck and headache pain with usual ADLs    Time  4    Period  Weeks    Status  New      PT SHORT TERM GOAL #3   Title  Improved left sidebending to 35 degrees and left rotation to 45 degrees needed for driving    Time  4    Period  Weeks    Status  New        PT Long Term Goals - 11/15/16 1434      PT LONG TERM GOAL #1   Title  The patient will return demo HEP for neck Flexibility, strengthening and posture re-education.    Time  8    Period  Weeks    Status  New    Target Date  01/10/17      PT LONG TERM GOAL #2   Title  The patient will report decreased pain and spasm by 50% with home and work ADLs    Time  8    Period  Weeks    Status  New      PT LONG TERM GOAL #3   Title  The patient will improve neck rotation  to 50 degrees needed for  driving    Time  8    Period  Weeks    Status  New      PT LONG TERM GOAL #4   Title  The patient will have improved shoulder, scapular and cervical strength grossly 4+/5 needed for the care of his wife and home/work ADLs    Time  8    Period  Weeks    Status  New      PT LONG TERM GOAL #5   Title  FOTO functional outcome score improved from 49% limitation to 38% indicating improved function with less pain    Time  8    Period  Weeks    Status  New            Plan - 12/15/16 1200    Clinical Impression Statement  Pt with only 1 session this week due to snow.  Pt reports 40% overall improvement in neck pain.  Pt required verbal cues with HEP to reduce activation of cervical muscles and upper traps.  Pt with reduced tension in bil neck muscles with dry needling.  Pt with reduced pain and tension after dry needling today.  Pt will continue to benefit from skilled PT for postural strength, cervical flexibility and manual therapy for pain and muscle tension.      Rehab Potential  Good    Clinical Impairments Affecting Rehab Potential  decreased memory    PT Frequency  2x / week    PT Duration  8 weeks    PT Treatment/Interventions  ADLs/Self Care Home Management;Electrical Stimulation;Cryotherapy;Traction;Moist Heat;Ultrasound;Therapeutic exercise;Therapeutic activities;Patient/family education;Neuromuscular re-education;Manual techniques;Taping;Dry needling    PT Next Visit Plan  assess response to dry needling, cervical/postural stabilization, cervical traction    Consulted and Agree with Plan of Care  Patient       Patient will benefit from skilled therapeutic intervention in  order to improve the following deficits and impairments:  Increased fascial restricitons, Pain, Postural dysfunction, Increased muscle spasms, Decreased range of motion, Decreased strength  Visit Diagnosis: Cervicalgia  Muscle weakness (generalized)  Cramp and spasm  Abnormal posture     Problem  List Patient Active Problem List   Diagnosis Date Noted  . Leg edema, right 07/20/2016  . 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 Varicose veins of lower extremities with other complications 31/49/7026     Sigurd Sos, PT 12/15/16 12:28 PM  [facility] [address], [place], [place], [address] Phone: [phone]   Fax:  [phone]  Name: KASTEN LEVEQUE MRN: [id number] Date of Birth: 1950-02-19

## 2016-12-19 ENCOUNTER — Telehealth: Payer: Self-pay

## 2016-12-19 ENCOUNTER — Other Ambulatory Visit: Payer: Self-pay | Admitting: Neurology

## 2016-12-19 ENCOUNTER — Ambulatory Visit: Payer: Medicare Other

## 2016-12-19 NOTE — Telephone Encounter (Signed)
PT called pt due to missed appointment today at 11:00.  Pt was not aware of his appointment.  Pt declined to reschedule the appointment.  Pt confirmed his next appointment on 12/22/16.

## 2016-12-22 ENCOUNTER — Ambulatory Visit: Payer: Medicare Other

## 2016-12-22 DIAGNOSIS — M6281 Muscle weakness (generalized): Secondary | ICD-10-CM

## 2016-12-22 DIAGNOSIS — R293 Abnormal posture: Secondary | ICD-10-CM

## 2016-12-22 DIAGNOSIS — M542 Cervicalgia: Secondary | ICD-10-CM | POA: Diagnosis not present

## 2016-12-22 DIAGNOSIS — R252 Cramp and spasm: Secondary | ICD-10-CM

## 2016-12-22 NOTE — Therapy (Addendum)
[facility] [address] [place], [place], [address] Phone: [phone]   Fax:  [phone]  Physical Therapy Treatment  Patient Details  Name: Scott Ibarra MRN: [id number] Date of Birth: 13-May-1950 Referring Provider: Dr. Jaynee Eagles   Encounter Date: 12/22/2016  PT End of Session - 12/22/16 1225    Visit Number  6    Number of Visits  10    Date for PT Re-Evaluation  01/10/17    Authorization Type  Medicare G codes;  KX at visit 15    PT Start Time  1149 dry needling    PT Stop Time  1240    PT Time Calculation (min)  51 min    Activity Tolerance  Patient tolerated treatment well    Behavior During Therapy  [facility] for tasks assessed/performed       Past Medical History:  Diagnosis Date  . Meniscus degeneration    L knee; has had repair x 2 and injections  . Psoriasis   . Varicose veins     Past Surgical History:  Procedure Laterality Date  . CHOLECYSTECTOMY  2011  . ligation and stripping left  1994  . [place] SURGERY  2013    There were no vitals filed for this visit.  Subjective Assessment - 12/22/16 1153    Subjective  I feel 60% overall improvement since the start of care.  I want to be placed on hold until I see a neurologist in January.      Currently in Pain?  Yes    Pain Score  1     Pain Location  Neck    Pain Orientation  Right;Left    Pain Descriptors / Indicators  Tightness;Discomfort    Pain Type  Chronic pain    Pain Onset  More than a month ago    Pain Frequency  Constant    Aggravating Factors   no pattern    Pain Relieving Factors  heat, lying down, Flexeril, dry needling         [facility] PT Assessment - 12/22/16 0001      Assessment   Medical Diagnosis  neck pain, myofascial pain      Observation/Other Assessments   Focus on Therapeutic Outcomes (FOTO)   34% limitation      AROM   Cervical - Right Side Bend  50    Cervical - Left Side Bend  35     Cervical - Right Rotation  70    Cervical - Left Rotation  75                  [facility] Adult PT Treatment/Exercise - 12/22/16 0001      Neck Exercises: Machines for Strengthening   UBE (Upper Arm Bike)  LEvel 1x 6 minutes (3/3)      Modalities   Modalities  Moist Heat      Moist Heat Therapy   Number Minutes Moist Heat  12 Minutes    Moist Heat Location  Cervical      Manual Therapy   Manual Therapy  Soft tissue mobilization;Myofascial release    Manual therapy comments  soft tissue mobilization and trigger point release to bil upper traps, subooccipitals and cervical paraspinals      Neck Exercises: Stretches   Other Neck Stretches  3 ways 20 seconds x 3 each       Trigger Point Dry Needling - 12/22/16 1206  Consent Given?  Yes    Muscles Treated Upper Body  Upper trapezius;Oblique capitus;Suboccipitals muscle group    Upper Trapezius Response  Twitch reponse elicited;Palpable increased muscle length    Oblique Capitus Response  Twitch response elicited;Palpable increased muscle length    SubOccipitals Response  Twitch response elicited;Palpable increased muscle length             PT Short Term Goals - 11/15/16 1432      PT SHORT TERM GOAL #1   Title  The patient will demonstrate awareness of proper posture, basic HEP and self care strategies    Time  4    Period  Weeks    Status  New    Target Date  12/13/16      PT SHORT TERM GOAL #2   Title  The patient will report a 25% reduction in neck and headache pain with usual ADLs    Time  4    Period  Weeks    Status  New      PT SHORT TERM GOAL #3   Title  Improved left sidebending to 35 degrees and left rotation to 45 degrees needed for driving    Time  4    Period  Weeks    Status  New        PT Long Term Goals - 12/22/16 1155      PT LONG TERM GOAL #1   Title  The patient will return demo HEP for neck Flexibility, strengthening and posture re-education.    Status  Achieved      PT  LONG TERM GOAL #2   Title  The patient will report decreased pain and spasm by 50% with home and work ADLs    Baseline  60%    Status  Achieved      PT LONG TERM GOAL #3   Title  The patient will improve neck rotation  to 50 degrees needed for driving    Status  Achieved      PT LONG TERM GOAL #4   Title  The patient will have improved shoulder, scapular and cervical strength grossly 4+/5 needed for the care of his wife and home/work ADLs    Time  8    Period  Weeks    Status  On-going      PT LONG TERM GOAL #5   Title  FOTO functional outcome score improved from 49% limitation to 38% indicating improved function with less pain    Baseline  34% limitation    Status  Achieved            Plan - 12/22/16 1207    Clinical Impression Statement  Pt reports 60% overall improvement in symptoms since the start of care.  FOTO is improved to 34% limitation (improved from evaluation).  Pt with improved cervcial A/ROM.  Pt will be placed on hold after today with probably D/C by 01/11/16 to HEP.    (Pended)     Rehab Potential  Good  (Pended)     PT Frequency  2x / week  (Pended)     PT Duration  8 weeks  (Pended)     PT Treatment/Interventions  ADLs/Self Care Home Management;Electrical Stimulation;Cryotherapy;Traction;Moist Heat;Ultrasound;Therapeutic exercise;Therapeutic activities;Patient/family education;Neuromuscular re-education;Manual techniques;Taping;Dry needling  (Pended)     PT Next Visit Plan  assess response to dry needling, cervical/postural stabilization, cervical traction  (Pended)     Consulted and Agree with Plan of Care  Patient  (Pended)  Patient will benefit from skilled therapeutic intervention in order to improve the following deficits and impairments:  (P) Increased fascial restricitons, Pain, Postural dysfunction, Increased muscle spasms, Decreased range of motion, Decreased strength  Visit Diagnosis: Cervicalgia  Muscle weakness (generalized)  Cramp and  spasm  Abnormal posture     Problem List Patient Active Problem List   Diagnosis Date Noted  . Leg edema, right 07/20/2016  . 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 Varicose veins of lower extremities with other complications 94/70/9628     Sigurd Sos, PT 12/22/16 12:29 PM PHYSICAL THERAPY DISCHARGE SUMMARY  Visits from Start of Care: 6  Current functional level related to goals / functional outcomes: See above for most current PT status.  Pt was going to see MD to determine future PT.     Remaining deficits: See above for most recent status.     Education / Equipment: HEP Plan: Patient agrees to discharge.  Patient goals were partially met. Patient is being discharged due to being pleased with the current functional level.  ?????        Sigurd Sos, PT 01/24/17 10:50 AM  [facility] [address] [place], [place], [address] Phone: [phone]   Fax:  [phone]  Name: Scott Ibarra MRN: [id number] Date of Birth: Apr 24, 1950

## 2017-01-10 ENCOUNTER — Ambulatory Visit (INDEPENDENT_AMBULATORY_CARE_PROVIDER_SITE_OTHER): Payer: Medicare Other | Admitting: Neurology

## 2017-01-10 ENCOUNTER — Encounter: Payer: Self-pay | Admitting: Neurology

## 2017-01-10 VITALS — BP 132/87 | HR 75 | Ht 73.75 in | Wt 244.8 lb

## 2017-01-10 DIAGNOSIS — G243 Spasmodic torticollis: Secondary | ICD-10-CM | POA: Insufficient documentation

## 2017-01-10 DIAGNOSIS — M542 Cervicalgia: Secondary | ICD-10-CM | POA: Insufficient documentation

## 2017-01-10 NOTE — Progress Notes (Signed)
PATIENT: Scott Ibarra DOB: Dec 17, 1950  Chief Complaint  Patient presents with  . Chronic Neck Pain    He is here with his wife, Scott Ibarra, to discuss Botox treatment (referred by Dr. Jaynee Eagles).     HISTORICAL  Scott Ibarra is a 67 year old male, accompanied by his wife Scott Ibarra, seen in refer by Dr. Jaynee Eagles for evaluation of Botox injection for chronic neck pain.  He had a history of mild neurocognitive disorder, predominantly dysexecutive.  Complains of chronic neck pain since 2005, is usually located at the right side, gradual worsening stiffness in his neck, was noted to have mild hypertrophy of the right upper trapezial, and lateral neck muscles, mild decreased range of motion, mild elevation of right shoulder, he noted worsening neck pain since October 2018, also involving left side,  Complains of mild knee pain, no radiating pain from neck to bilateral shoulder, he has tried NSAIDs, muscle relaxant with limited help.  REVIEW OF SYSTEMS: Full 14 system review of systems performed and notable only for as above ALLERGIES: No Known Allergies  HOME MEDICATIONS: Current Outpatient Medications  Medication Sig Dispense Refill  . alfuzosin (UROXATRAL) 10 MG 24 hr tablet     . buPROPion (WELLBUTRIN XL) 150 MG 24 hr tablet TAKE 2 TABLETS BY MOUTH ONCE DAILY IN THE MORNING 60 tablet 0  . clobetasol cream (TEMOVATE) 0.05 %     . cyclobenzaprine (FLEXERIL) 5 MG tablet Take 1 tablet (5 mg total) by mouth 3 (three) times daily as needed for muscle spasms. 30 tablet 12  . naproxen (NAPROSYN) 500 MG tablet Take 1 tablet (500 mg total) by mouth 2 (two) times daily as needed. Take with meals. Watch for GI upset or red or dark stools 60 tablet 3  . tiZANidine (ZANAFLEX) 4 MG tablet Take 1 tablet (4 mg total) by mouth every 6 (six) hours as needed for muscle spasms. 90 tablet 6  . triamcinolone cream (KENALOG) 0.1 %     . mirabegron ER (MYRBETRIQ) 25 MG TB24 tablet Take 25 mg by mouth daily.  For three weeks, followed by 50 mg daily thereafter 25 mg started on 11/03/16     No current facility-administered medications for this visit.     PAST MEDICAL HISTORY: Past Medical History:  Diagnosis Date  . Meniscus degeneration    L knee; has had repair x 2 and injections  . Psoriasis   . Varicose veins     PAST SURGICAL HISTORY: Past Surgical History:  Procedure Laterality Date  . CHOLECYSTECTOMY  2011  . ligation and stripping left  1994  . MENISCUS REPAIR    . VARICOSE VEIN SURGERY  2013    FAMILY HISTORY: Family History  Problem Relation Age of Onset  . Stroke Mother   . Cancer Father     SOCIAL HISTORY:  Social History   Socioeconomic History  . Marital status: Married    Spouse name: Scott Ibarra  . Number of children: Not on file  . Years of education: 96  . Highest education level: Not on file  Social Needs  . Financial resource strain: Not on file  . Food insecurity - worry: Not on file  . Food insecurity - inability: Not on file  . Transportation needs - medical: Not on file  . Transportation needs - non-medical: Not on file  Occupational History  . Occupation: Catering manager- retired   Tobacco Use  . Smoking status: Never Smoker  . Smokeless tobacco: Never Used  Substance and Sexual Activity  . Alcohol use: Yes    Comment: "1 beer every once in awhile"  . Drug use: No  . Sexual activity: Not on file  Other Topics Concern  . Not on file  Social History Narrative   Lives at home with wife.   Caffeine use: 2-3 cups of coffee a day    Right-handed     PHYSICAL EXAM   Vitals:   01/10/17 1529  Weight: 244 lb 12 oz (111 kg)  Height: 6' 1.75" (1.873 m)    Not recorded      Body mass index is 31.64 kg/m.  PHYSICAL EXAMNIATION:  Gen: NAD, conversant, well nourised, obese, well groomed                     Cardiovascular: Regular rate rhythm, no peripheral edema, warm, nontender. Eyes: Conjunctivae clear without exudates or  hemorrhage Neck: Supple, no carotid bruits. Pulmonary: Clear to auscultation bilaterally   NEUROLOGICAL EXAM:  MENTAL STATUS: Speech:    Speech is normal; fluent and spontaneous with normal comprehension.  Cognition:     Orientation to time, place and person     Normal recent and remote memory     Normal Attention span and concentration     Normal Language, naming, repeating,spontaneous speech     Fund of knowledge   CRANIAL NERVES: CN II: Visual fields are full to confrontation. Fundoscopic exam is normal with sharp discs and no vascular changes. Pupils are round equal and briskly reactive to light. CN III, IV, VI: extraocular movement are normal. No ptosis. CN V: Facial sensation is intact to pinprick in all 3 divisions bilaterally. Corneal responses are intact.  CN VII: Face is symmetric with normal eye closure and smile. CN VIII: Hearing is normal to rubbing fingers CN IX, X: Palate elevates symmetrically. Phonation is normal. CN XI: Head turning and shoulder shrug are intact CN XII: Tongue is midline with normal movements and no atrophy.  MOTOR: There is no pronator drift of out-stretched arms. Muscle bulk and tone are normal. Muscle strength is normal.  REFLEXES: Reflexes are 2+ and symmetric at the biceps, triceps, knees, and ankles. Plantar responses are flexor.  SENSORY: Intact to light touch, pinprick, positional sensation and vibratory sensation are intact in fingers and toes.  COORDINATION: Rapid alternating movements and fine finger movements are intact. There is no dysmetria on finger-to-nose and heel-knee-shin.    GAIT/STANCE: Posture is normal. Gait is steady with normal steps, base, arm swing, and turning. Heel and toe walking are normal. Tandem gait is normal.  Romberg is absent.   DIAGNOSTIC DATA (LABS, IMAGING, TESTING) - I reviewed patient records, labs, notes, testing and imaging myself where available.   ASSESSMENT AND PLAN  Scott Ibarra is  a 67 y.o. male   Abnormal neck posturing, chronic right side neck pain  He has mild lateral tilt, mild atrophy of right posterior and medial scalenus, right upper trapezius  Will try low-dose xeomin injection 100 units under EMG guidance  Scott Ibarra, M.D. Ph.D.  [facility] [address], [place], [place] [address] Ph: [phone] Fax: [phone]  CC: Referring Provider

## 2017-01-27 ENCOUNTER — Other Ambulatory Visit: Payer: Self-pay | Admitting: Neurology

## 2017-02-16 ENCOUNTER — Ambulatory Visit: Payer: Medicare Other | Admitting: Neurology

## 2017-03-09 ENCOUNTER — Ambulatory Visit: Payer: Medicare Other | Admitting: Psychology

## 2017-03-09 ENCOUNTER — Ambulatory Visit (INDEPENDENT_AMBULATORY_CARE_PROVIDER_SITE_OTHER): Payer: Medicare Other | Admitting: Psychology

## 2017-03-09 ENCOUNTER — Encounter: Payer: Self-pay | Admitting: Psychology

## 2017-03-09 DIAGNOSIS — R413 Other amnesia: Secondary | ICD-10-CM | POA: Diagnosis not present

## 2017-03-09 NOTE — Progress Notes (Addendum)
   Neuropsychology Note  NICK STULTS completed 90 minutes of neuropsychological testing with technician, Milana Kidney, BS, under the supervision of Dr. Macarthur Critchley, Licensed Psychologist. The patient did not appear overtly distressed by the testing session, per behavioral observation or via self-report to the technician. Rest breaks were offered.   Clinical Decision Making: In considering the patient's current level of functioning, level of presumed impairment, nature of symptoms, emotional and behavioral responses during the interview, level of literacy, and observed level of motivation/effort, a battery of tests was selected and communicated to the psychometrician.  Communication between the psychologist and technician was ongoing throughout the testing session and changes were made as deemed necessary based on patient performance on testing, technician observations and additional pertinent factors such as those listed above.  Lyndle Herrlich will return within approximately 2 weeks for an interactive feedback session with Dr. Si Raider at which time his test performances, clinical impressions and treatment recommendations will be reviewed in detail. The patient understands he can contact our office should he require our assistance before this time.  15 minutes spent performing neuropsychological evaluation services/clinical decision making (psychologist). [CPT 06015] 61 minutes spent face-to-face with patient administering standardized tests, 30 minutes spent scoring (technician). [CPT [id number], 53794]  Full report to follow.

## 2017-03-09 NOTE — Progress Notes (Signed)
NEUROBEHAVIORAL STATUS EXAM   Name: Scott Ibarra Date of Birth: 1950-10-07 Date of Interview: 03/09/2017  Reason for Referral:  Scott Ibarra is a 67 y.o. male who is referred for neuropsychological evaluation by Dr. Sarina Ill of [facility] due to concerns about memory loss. This patient is unaccompanied in the office for today's visit.  History of Presenting Problem:  [name] reports gradual onset and progressive worsening of memory difficulty over the past few years. He is followed by Dr. Jaynee Eagles. MRI brain performed on 08/28/2015 reportedly showed some scattered T2/FLAIR hyperintense foci in the subcortical and deep white matter of the hemispheres and in the pons consistent with mild chronic microvascular ischemic change, "probably within normal limits for age", brain volume normal for age and no acute findings. He underwent neurocognitive evaluation on 09/03/2015 with Dr. Vikki Ports; results were largely within normal limits but there was evidence of mild executive dysfunction. There was no evidence of AD and memory function was an area of relative strength. He had a sleep study in 09/2015 which was negative. There was some question of possible ADHD so he was tried on Adderall which did not help. He was switched to Wellbutrin and reports that this helped his mood but he ran out of the medication and has not yet gotten refills so he has not been taking it. At his last visit with Dr. Jaynee Eagles on 11/04/2016, he and his family reported irritability and mood/personality changes. He had a PET brain scan on 12/08/2016 which was normal with no indication of AD.   He reportedly has a family history of dementia (likely AD) in two maternal aunts. He reported his mother started showing signs of memory loss but then passed away from a stroke. She died at age 83.   Since his last evaluation 1 1/2 years ago, he feels there has been a "slight decline" in memory/cognitive function. He has  started using coping strategies to compensate for difficulties and this has been helpful. He keeps a notebook and writes notes to help remind him of things. He endorses difficulty recalling conversations. He continues to note attention/concentration difficulties and thinks this has possibly worsened. He experiences some word finding difficulty. He denies any problems with comprehension or receptive language. He is still having problems with driving directions. He continues to work part time at [employer] and denies any problems performing his job duties. He is not currently taking any medications. His daughter has taken over the finances; the patient's wife used to manage these but she had a stroke in August and is disabled. He is managing all the cooking / meal preparation and denies difficulty with this. He manages his appointments without any problems.   He does have significant stress related to caring for his wife who already had significant medical problems and then had a stroke in August and is now mostly bed-ridden. He helps her do PT exercises because having the physical therapist come is too expensive for them. He reports that his mood is not depressed but is "angy over what's happened" to his wife. He also has chronic leg pain and constant headache/shoulder muscle tightness. He staggers sometimes due to his leg pain. He has not had any falls. He has no history of head injury. He can fall asleep easily but bladder problems wake him frequently. There has been no change in or problem with appetite. He denies suicidal ideation. He has a history of some situational depression in the past  but no other reported mental health history. He drinks one shot of Wild [place] a couple of times a week. 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 Marital history: Married with one daughter and one son, one grandchild and 3 step-grandchildren. He lives with his wife and daughter. Alcohol: One shot of liquor 2 days a week on average. Tobacco: Never a smoker   Medical History: Past Medical History:  Diagnosis Date  . Meniscus degeneration    L knee; has had repair x 2 and injections  . Psoriasis   . Varicose veins      Current Medications:  Patient reports he is not currently taking any medications. States he ran out of Wellbutrin and needs a refill from Dr. Jaynee Eagles.  Behavioral Observations:   Appearance: Neatly, casually and appropriately dressed and groomed Gait: Ambulated independently, mild unsteadiness Speech: Fluent; normal rate, rhythm and volume. No significant word finding difficulty. Thought process: Linear, goal directed Affect: Full, appropriate Interpersonal: Pleasant, appropriate   45 minutes spent face-to-face with patient completing neurobehavioral status exam. 40 minutes spent integrating medical records/clinical data and completing this report. CPT code [id number].   TESTING: There is medical necessity to proceed with neuropsychological assessment as the results will be used to aid in differential diagnosis and clinical decision-making and to inform specific treatment recommendations. Per the patient and medical records reviewed, there has been a change in cognitive functioning and a reasonable suspicion of neurocognitive disorder versus pseudodementia (stress/depression).  Clinical Decision Making: In considering the patient's current level of functioning, level of presumed impairment, nature of symptoms, emotional and behavioral responses during the interview, level of literacy, and observed level of motivation, a battery of tests was selected and communicated to  the psychometrician.   Following the clinical interview/neurobehavioral status exam, the patient completed this full battery of neuropsychological testing with my psychometrician under my supervision (see separate note).   PLAN: The patient will return to see me for a follow-up session at which time his test performances and my impressions and treatment recommendations will be reviewed in detail.  Evaluation ongoing; full report to follow.

## 2017-03-23 ENCOUNTER — Telehealth: Payer: Self-pay | Admitting: Neurology

## 2017-03-23 NOTE — Telephone Encounter (Signed)
Noted, thank you

## 2017-03-23 NOTE — Telephone Encounter (Signed)
Pt called r/s xeomin to 5/8  FYI

## 2017-04-18 ENCOUNTER — Encounter: Payer: No Typology Code available for payment source | Admitting: Psychology

## 2017-05-09 NOTE — Progress Notes (Signed)
NEUROPSYCHOLOGICAL EVALUATION   Name:    Scott Ibarra  Date of Birth:   [birth year] Date of Interview:  03/09/2017 Date of Testing:  03/09/2017   Date of Feedback:  05/11/2017       Background Information:  Reason for Referral:  Scott Ibarra is a 67 y.o. male referred by Dr. Sarina Ill of [facility] to assess his current level of cognitive functioning and assist in differential diagnosis. The current evaluation consisted of a review of available medical records, an interview with the patient, and the completion of a neuropsychological testing battery. Informed consent was obtained.  History of Presenting Problem:  Scott Ibarra reports gradual onset and progressive worsening of memory difficulty over the past few years. He is followed by Dr. Jaynee Eagles. MRI brain performed on 08/28/2015 reportedly showed some scattered T2/FLAIR hyperintense foci in the subcortical and deep white matter of the hemispheres and in the pons consistent with mild chronic microvascular ischemic change, "probably within normal limits for age", brain volume normal for age and no acute findings. He underwent neurocognitive evaluation on 09/03/2015 with Dr. Vikki Ports; results were largely within normal limits but there was evidence of mild executive dysfunction. There was no evidence of AD and memory function was an area of relative strength. He had a sleep study in 09/2015 which was negative. There was some question of possible ADHD so he was tried on Adderall which did not help. He was switched to Wellbutrin and reports that this helped his mood but he ran out of the medication and has not yet gotten refills so he has not been taking it. At his last visit with Dr. Jaynee Eagles on 11/04/2016, he and his family reported irritability and mood/personality changes. He had a PET brain scan on 12/08/2016 which was normal with no indication of AD.   He reportedly has a family history of dementia (likely AD) in two  maternal aunts. He reported his mother started showing signs of memory loss but then passed away from a stroke. She died at age 46.   Since his last evaluation 1 1/2 years ago, he feels there has been a "slight decline" in memory/cognitive function. He has started using coping strategies to compensate for difficulties and this has been helpful. He keeps a notebook and writes notes to help remind him of things. He endorses difficulty recalling conversations. He continues to note attention/concentration difficulties and thinks this has possibly worsened. He experiences some word finding difficulty. He denies any problems with comprehension or receptive language. He is still having problems with driving directions. He continues to work part time at [employer] and denies any problems performing his job duties. He is not currently taking any medications. His daughter has taken over the finances; the patient's wife used to manage these but she had a stroke in August and is disabled. He is managing all the cooking / meal preparation and denies difficulty with this. He manages his appointments without any problems.   He does have significant stress related to caring for his wife who already had significant medical problems and then had a stroke in August and is now mostly bed-ridden. He helps her do PT exercises because having the physical therapist come is too expensive for them. He reports that his mood is not depressed but is "angry over what's happened" to his wife. He also has chronic leg pain and constant headache/shoulder muscle tightness. He staggers sometimes due to his leg pain. He has not  had any falls. He has no history of head injury. He can fall asleep easily but bladder problems wake him frequently. There has been no change in or problem with appetite. He denies suicidal ideation. He has a history of some situational depression in the past but no other reported mental health history. He drinks  one shot of Wild [place] a couple of times a week. 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 Marital history: Married with one daughter and one son, one grandchild and 3 step-grandchildren. He lives with his wife and daughter. Alcohol: One shot of liquor 2 days a week on average. Tobacco: Never a smoker   Medical History:  Past Medical History:  Diagnosis Date  . Meniscus degeneration    L knee; has had repair x 2 and injections  . Psoriasis   . Varicose veins     Current medications:  Outpatient Encounter Medications as of 05/11/2017  Medication Sig  . alfuzosin (UROXATRAL) 10 MG 24 hr tablet   . buPROPion (WELLBUTRIN XL) 150 MG 24 hr tablet TAKE 2 TABLETS BY MOUTH ONCE DAILY IN THE MORNING  . clobetasol cream (TEMOVATE) 0.05 %   . cyclobenzaprine (FLEXERIL) 5 MG tablet Take 1 tablet (5 mg total) by mouth 3 (three) times daily as needed for muscle spasms.  . mirabegron ER (MYRBETRIQ) 25 MG TB24 tablet Take 25 mg by mouth daily. For three weeks, followed by 50 mg daily thereafter 25 mg started on 11/03/16  . naproxen (NAPROSYN) 500 MG tablet Take 1 tablet (500 mg total) by mouth 2 (two) times daily as needed. Take with meals. Watch for GI upset or red or dark stools  . tiZANidine (ZANAFLEX) 4 MG tablet Take 1 tablet (4 mg total) by mouth every 6 (six) hours as needed for muscle spasms.  Marland Kitchen triamcinolone cream (KENALOG) 0.1 %    No facility-administered encounter medications on file as of 05/11/2017.     Patient reports he is not currently taking any medications.    Current Examination:  Behavioral Observations:  Appearance: Neatly, casually and appropriately dressed and  groomed Gait: Ambulated independently, mild unsteadiness Speech: Fluent; normal rate, rhythm and volume. No significant word finding difficulty. Thought process: Linear, goal directed Affect: Full, appropriate Interpersonal: Pleasant, appropriate Orientation: Oriented to all spheres. Accurately named the current President and his predecessor.   Tests Administered: . Test of Premorbid Functioning (TOPF) . Wechsler Adult Intelligence Scale-Fourth Edition (WAIS-IV): Similarities, Block Design,  Arithmetic, Coding and Digit Span subtests . Wechsler Memory Scale-Fourth Edition (WMS-IV)Older Adult Version (ages 33-90): Logical Memory I, II and Recognition subtests  . [employer] Verbal Learning Test - 2nd Edition (CVLT-2) Short Form . Conners Continuous Performance Test 3rd Edition (CPT3) . Repeatable Battery for the Assessment of Neuropsychological Status (RBANS) Form A:  Figure Copy and Figure Recall subtests and Semantic Fluency subtest . Controlled Oral Word Association Test (COWAT) . Trail Making Test A and B . [place] (BNT) . Boston Diagnostic Aphasia Examination (BDAE): Complex Ideational Material Subtest . Clock Drawing Test . Beck Depression Inventory - Second edition (BDI-II) . Generalized Anxiety Disorder - 7 item screener (GAD-7)  Test Results: Note: Standardized scores are presented only for use  by appropriately trained professionals and to allow for any future test-retest comparison. These scores should not be interpreted without consideration of all the information that is contained in the rest of the report. The most recent standardization samples from the test publisher or other sources were used whenever possible to derive standard scores; scores were corrected for age, gender, ethnicity and education when available.   Test Scores:  Test Name Raw Score Standardized Score Descriptor  TOPF 60/70 SS= 117 High average  WAIS-IV Subtests     Similarities 24/36 ss= 10  Average  Block Design 24/66 ss= 8 Average  Arithmetic 18/22 ss= 14 Superior  Coding 42/135 ss= 8 Average  Digit Span 26/48 ss= 10 Average   WAIS-IV Index Score     Working Memory  SS= 111 High average  WMS-IV Subtests     LM I 41/53 ss= 14 Superior  LM II 28/39 ss= 14 Superior  LM II Recognition 21/23 Cum %: >75 WNL  CVLT-II Scores     Trial 1 5/9 Z= -0.5 Average  Trial 4 8/9 Z= 0.5 Average   Trials 1-4 total 27/36 T= 53 Average  SD Free Recall 6/9 Z= -0.5 Average  LD Free Recall 6/9 Z= 0 Average  LD Cued Recall 6/9 Z= -0.5 Average  Recognition Discriminability 9/9 hits; 0 false positives Z= 1 High average  Forced Choice Recognition 9/9  WNL  CPT3     Detectability  T= 54 Average  Omissions  T= 46 Average  Commissions  T= 59 High average  Perseverations  T= 44 Low average  HRT SD  T= 75 Very elevated  HRT Block Change  T= 85 Very elevated  HRT ISI Change  T= 73 Very elevated  RBANS Subtests     Figure Copy 18/20 Z= -0.1 Average  Figure Recall 17/20 Z= 0.9 High average  Semantic Fluency 6 Z= -3.3 Severely impaired  COWAT-FAS 38 T= 47 Average  COWAT-Animals 14 T= 40 Low average  BNT 59/60 T= 60 High average  BDAE Complex Ideational Material 12/12  WNL  Trail Making Test A  41" 1 error T= 46 Average  Trail Making Test B  134" 2 errors T= 36 Borderline  Clock Drawing Test   WNL  BDI-II 15/63  Mild  GAD-7 10/21  Moderate     Description of Test Results:  Premorbid verbal intellectual abilities were estimated to have been within the high average range based on a test of word reading.   Psychomotor processing speed was average (improved relative to 2017).   Auditory attention and working memory were high average overall (stable). On a test of sustained visual attention highly sensitive to ADHD, he demonstrated four atypical T scores which is associated with a high likelihood of an attentional disorder. His profile of scores and responses patterns indicated issues with  inattentiveness, sustained attention, and vigilance.   Visual-spatial construction was average (stable).   Language abilities were variable. Specifically, confrontation naming was high average (stable), and semantic verbal fluency ranged from low average (for animals, stable) to severely impaired (for fruits/vegetables). Of note, he demonstrated poor strategy on this task, as he attempted to recite words in alphabetical order rather than simply by category. This likely affected performance. Auditory comprehension of complex ideational material was intact.   With regard to verbal memory, encoding and acquisition of non-contextual information (i.e., word list) was average. After a brief distracter task, free recall was average. After a delay, free recall was average. Cued recall was average.  Performance on a yes/no recognition task was high average with 100% accuracy. On another verbal memory test, encoding and acquisition of contextual auditory information (i.e., short stories) was superior (stable). After a delay, free recall was superior (improved). Performance on a yes/no recognition task was above average (stable). With regard to non-verbal memory, delayed free recall of visual information was high average (stable).   Performances on tasks measuring various aspects of executive functioning were variable. Mental flexibility and set-shifting were borderline impaired on Trails B (improved). Verbal fluency with phonemic search restrictions was average (stable to improved). Verbal abstract reasoning was average. Performance on a clock drawing task was normal.   On a self-report measure of mood, the patient's responses were indicative of clinically significant depression at the present time. Symptoms endorsed included: sadness much of the time, pessimism, feelings of failure, restlessness, loss of interest, indecisiveness, feelings of worthlessness, loss of energy, sleep disturbance, irritability,  concentration difficulty, fatigue. He also endorsed passive suicidal ideation but denied intention or plan. On a self-report measure of anxiety, the patient endorsed clinically significant generalized anxiety characterized by fear of something awful happening, irritability, not being able to control worry, nervousness, excessive worries, and difficulty relaxing.    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 Major depressive episode, mild. Adjustment disorder with anxious mood. Results of cognitive testing indicated mostly normal cognitive function. He did demonstrate difficulties with sustained attention and mental flexibility/set-shifting. Memory was an area of relative strength. His testing profile and performances are consistent with those of his 2017 evaluation. There is no evidence of interval decline. His cognitive profile continues to suggest frontal-subcortical involvement rather than cortical dysfunction. Mild cognitive impairment may be related to small vessel disease. I suspect depression, anxiety and caregiver stress are also playing a large role.     Recommendations/Plan: Based on the findings of the present evaluation, the following recommendations are offered:  1. I encouraged the patient to discuss with Dr. Jaynee Eagles resuming Wellbutrin. He was taking this medication before but then did not get it refilled and thus has not been taking it. He should also watch alcohol use for risk of self-medicating (and due to risk of interaction with Wellbutrin). I highly recommend he consider counseling for caregiver stress, which he is open to. He has been referred to other providers but they did not take insurance. He is amenable to me placing a referral to [facility] as I think at least one of their therapists takes Medicare. 2. He is utilizing good behavioral strategies to help him compensate for cognitive  symptoms. I provided a list of additional strategies for executive dysfunction. 3. He was reassured there is no sign of no dementia or AD.   Feedback to Patient: Scott Ibarra and his wife returned for a feedback appointment on 05/11/2017 to review the results of his neuropsychological evaluation with this provider. 20 minutes face-to-face time was spent reviewing his test results, my impressions and my recommendations as detailed above.    Total time spent on this patient's case: 85 minutes for neurobehavioral status exam with psychologist (CPT code [phone]); 120 minutes of testing/scoring by psychometrician under psychologist's supervision (CPT codes [phone], [phone] units); 180 minutes for integration of patient data, interpretation of standardized test results and clinical data, clinical decision making, treatment planning and preparation of this report, and interactive feedback with review of results to the patient/family by psychologist (CPT codes [phone], [phone] units).      Thank you  for your referral of Scott Ibarra. Please feel free to contact me if you have any questions or concerns regarding this report.

## 2017-05-10 ENCOUNTER — Encounter: Payer: Self-pay | Admitting: Neurology

## 2017-05-10 ENCOUNTER — Ambulatory Visit (INDEPENDENT_AMBULATORY_CARE_PROVIDER_SITE_OTHER): Payer: Medicare Other | Admitting: Neurology

## 2017-05-10 VITALS — BP 149/79 | HR 77 | Ht 73.75 in | Wt 247.5 lb

## 2017-05-10 DIAGNOSIS — M542 Cervicalgia: Secondary | ICD-10-CM

## 2017-05-10 DIAGNOSIS — G243 Spasmodic torticollis: Secondary | ICD-10-CM | POA: Diagnosis not present

## 2017-05-10 MED ORDER — INCOBOTULINUMTOXINA 100 UNITS IM SOLR
100.0000 [IU] | INTRAMUSCULAR | Status: DC
  Administered 2017-05-10: 100 [IU] via INTRAMUSCULAR

## 2017-05-10 NOTE — Progress Notes (Signed)
PATIENT: Scott Ibarra DOB: 08-May-1950  Chief Complaint  Patient presents with  . Cervical Dystonia    Xeomin 100 units x 1 vial - office supply     HISTORICAL  Scott Ibarra is a 67 year old male, accompanied by his wife [name], seen in refer by Dr. Jaynee Eagles for evaluation of Botox injection for chronic neck pain.  He had a history of mild neurocognitive disorder, predominantly dysexecutive.  Complains of chronic neck pain since 2005, is usually located at the right side, gradual worsening stiffness in his neck, was noted to have mild hypertrophy of the right upper trapezial, and lateral neck muscles, mild decreased range of motion, mild elevation of right shoulder, he noted worsening neck pain since October 2018, also involving left side,  Complains of mild knee pain, no radiating pain from neck to bilateral shoulder, he has tried NSAIDs, muscle relaxant with limited help.  UPDATE May 10 2017: This is his first EMG guided xeomin injection for his neck pain, abnormal neck posturing, the pain is mainly at right posterior neck.  REVIEW OF SYSTEMS: Full 14 system review of systems performed and notable only for as above ALLERGIES: No Known Allergies  HOME MEDICATIONS: Current Outpatient Medications  Medication Sig Dispense Refill  . buPROPion (WELLBUTRIN XL) 150 MG 24 hr tablet TAKE 2 TABLETS BY MOUTH ONCE DAILY IN THE MORNING 60 tablet 0  . clobetasol cream (TEMOVATE) 2.83 % Apply 1 application topically as needed.     . cyclobenzaprine (FLEXERIL) 5 MG tablet Take 1 tablet (5 mg total) by mouth 3 (three) times daily as needed for muscle spasms. 30 tablet 12  . incobotulinumtoxinA (XEOMIN) 100 units SOLR injection Inject 100 Units into the muscle every 3 (three) months.    . naproxen (NAPROSYN) 500 MG tablet Take 1 tablet (500 mg total) by mouth 2 (two) times daily as needed. Take with meals. Watch for GI upset or red or dark stools 60 tablet 3  . tiZANidine (ZANAFLEX) 4 MG  tablet Take 1 tablet (4 mg total) by mouth every 6 (six) hours as needed for muscle spasms. 90 tablet 6  . triamcinolone cream (KENALOG) 0.1 % Apply topically as needed.      No current facility-administered medications for this visit.     PAST MEDICAL HISTORY: Past Medical History:  Diagnosis Date  . Meniscus degeneration    L knee; has had repair x 2 and injections  . Psoriasis   . Varicose veins     PAST SURGICAL HISTORY: Past Surgical History:  Procedure Laterality Date  . CHOLECYSTECTOMY  2011  . ligation and stripping left  1994  . MENISCUS REPAIR    . VARICOSE VEIN SURGERY  2013    FAMILY HISTORY: Family History  Problem Relation Age of Onset  . Stroke Mother   . Cancer Father     SOCIAL HISTORY:  Social History   Socioeconomic History  . Marital status: Married    Spouse name: [name]  . Number of children: Not on file  . Years of education: 86  . Highest education level: Not on file  Occupational History  . Occupation: Catering manager- retired   Scientific laboratory technician  . Financial resource strain: Not on file  . Food insecurity:    Worry: Not on file    Inability: Not on file  . Transportation needs:    Medical: Not on file    Non-medical: Not on file  Tobacco Use  . Smoking status: Never  Smoker  . Smokeless tobacco: Never Used  Substance and Sexual Activity  . Alcohol use: Yes    Comment: "1 beer every once in awhile"  . Drug use: No  . Sexual activity: Not on file  Lifestyle  . Physical activity:    Days per week: Not on file    Minutes per session: Not on file  . Stress: Not on file  Relationships  . Social connections:    Talks on phone: Not on file    Gets together: Not on file    Attends religious service: Not on file    Active member of club or organization: Not on file    Attends meetings of clubs or organizations: Not on file    Relationship status: Not on file  . Intimate partner violence:    Fear of current or ex partner: Not  on file    Emotionally abused: Not on file    Physically abused: Not on file    Forced sexual activity: Not on file  Other Topics Concern  . Not on file  Social History Narrative   Lives at home with wife.   Caffeine use: 2-3 cups of coffee a day    Right-handed     PHYSICAL EXAM   Vitals:   05/10/17 1335  BP: (!) 149/79  Pulse: 77  Weight: 247 lb 8 oz (112.3 kg)  Height: 6' 1.75" (1.873 m)    Not recorded      Body mass index is 31.99 kg/m.  PHYSICAL EXAMNIATION:  Gen: NAD, conversant, well nourised, obese, well groomed                     Cardiovascular: Regular rate rhythm, no peripheral edema, warm, nontender. Eyes: Conjunctivae clear without exudates or hemorrhage Neck: Supple, no carotid bruits. Pulmonary: Clear to auscultation bilaterally   NEUROLOGICAL EXAM:  MENTAL STATUS: Speech:    Speech is normal; fluent and spontaneous with normal comprehension.  Cognition:     Orientation to time, place and person     Normal recent and remote memory     Normal Attention span and concentration     Normal Language, naming, repeating,spontaneous speech     Fund of knowledge   CRANIAL NERVES: CN II: Visual fields are full to confrontation. Fundoscopic exam is normal with sharp discs and no vascular changes. Pupils are round equal and briskly reactive to light. CN III, IV, VI: extraocular movement are normal. No ptosis. CN V: Facial sensation is intact to pinprick in all 3 divisions bilaterally. Corneal responses are intact.  CN VII: Face is symmetric with normal eye closure and smile. CN VIII: Hearing is normal to rubbing fingers CN IX, X: Palate elevates symmetrically. Phonation is normal. CN XI: Head turning and shoulder shrug are intact CN XII: Tongue is midline with normal movements and no atrophy.  MOTOR: There is no pronator drift of out-stretched arms. Muscle bulk and tone are normal. Muscle strength is normal.  REFLEXES: Reflexes are 2+ and symmetric  at the biceps, triceps, knees, and ankles. Plantar responses are flexor.  SENSORY: Intact to light touch, pinprick, positional sensation and vibratory sensation are intact in fingers and toes.  COORDINATION: Rapid alternating movements and fine finger movements are intact. There is no dysmetria on finger-to-nose and heel-knee-shin.    GAIT/STANCE: Posture is normal. Gait is steady with normal steps, base, arm swing, and turning. Heel and toe walking are normal. Tandem gait is normal.  Romberg is absent.  DIAGNOSTIC DATA (LABS, IMAGING, TESTING) - I reviewed patient records, labs, notes, testing and imaging myself where available.   ASSESSMENT AND PLAN  KEYAAN LEDERMAN is a 67 y.o. male   Abnormal neck posturing, chronic right side neck pain  He has mild left lateral tilt, mild atrophy of right posterior and medial scalenus, right upper trapezius  This is his first EMG guided  xeomin injection for cervical dystonia, right side more than left side posterior neck pain,  We used xeomin 100 units  Left levator scapular 25 units Left splenius capitis 12.5 units Left splenius cervix 12.5 units Left illiocostallis12.5 units Left upper trapezius 12.5 units left longissimus capitis  Right levator scapular 25 units  She will return in 3 months for repeat injection   [name], M.D. Ph.D.  [facility] [address] [place], [place] [address] Ph: [phone] Fax: [phone]  CC: Referring Provider

## 2017-05-10 NOTE — Progress Notes (Signed)
**  Xeomin 100 units x 1 vial, [place] 3496-1164-35, [id number], Exp 06/2019, office supply.//mck,rn**

## 2017-05-11 ENCOUNTER — Encounter: Payer: Self-pay | Admitting: Psychology

## 2017-05-11 ENCOUNTER — Ambulatory Visit (INDEPENDENT_AMBULATORY_CARE_PROVIDER_SITE_OTHER): Payer: Medicare Other | Admitting: Psychology

## 2017-05-11 DIAGNOSIS — G3184 Mild cognitive impairment, so stated: Secondary | ICD-10-CM

## 2017-05-11 DIAGNOSIS — F4323 Adjustment disorder with mixed anxiety and depressed mood: Secondary | ICD-10-CM

## 2017-05-11 NOTE — Addendum Note (Signed)
Addended by: Glenford Bayley B on: 05/11/2017 10:26 AM   Modules accepted: Orders

## 2017-05-11 NOTE — Patient Instructions (Signed)
Fortunately, results of cognitive testing were stable and did not demonstrate any decline since your last evaluation.   You do not have dementia and there are no signs of developing Alzheimer's disease.  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 I highly recommend you restart Wellbutrin (under your doctor's guidance) and consider counseling for caregiver support and stress management.    The effect of depression and anxiety on your cognitive functioning: . One of the typical symptoms of depression is difficulty concentrating and making decisions, and various types of anxiety also interfere with attention and concentration . Problems with attention and concentration can disrupt the process of learning and making new memories, which can make it seem like there is a problem with your memory. In your daily life, you may experience this disruption as forgetting names and appointments, misplacing items, and needing to make lists for shopping and errands. It may be harder for you to stay focused on tasks and feel as "sharp" as you did in the past.  . Also, when we are depressed or anxious, we often pay more attention to our difficulties (rather than our strengths) in our daily life, and this can make it seem to [place] like we are doing worse cognitively than we really are. . The cognitive aspects of depression and anxiety are sometimes observed as an identifiable pattern of poor performance on a neuropsychological evaluation, but it is also possible that all scores on an evaluation are within normal limits. . Regardless of the test scores, distress related to depression and anxiety can interfere with the ability to make use of your cognitive resources and function optimally across settings such as work or school, maintaining the home and responsibilities, and personal  relationships. . Fortunately, there are treatments for depression and anxiety, and when mood improves, cognitive functioning in daily life often improves. . Treatment options include psychotherapy, medications (e.g., antidepressants), and behavioral changes, such as increasing your involvement in enjoyable activities, increasing the amount of exercise you are getting, and maintaining a regular routine.   Strategies to enhance cognitive functioning Attention, concentration, memory encoding and consolidation    . Make a plan and be prepared o If you find that you are more attentive at certain times of the day (i.e., the morning), plan important activities and discussions at that time o Determine which activities take the most time and which are most important, then prioritize your "to do list" based on this information o Break tasks into simpler parts, understand the steps involved before starting o Rehearse the steps mentally or write them down. If you write them down, you can use this as a checklist to check off as you complete them. o Visualize completing the task  . Use external aids  o Write everything down that you do not need to know or work with right now. Don't store extra information in your brain that you don't need right now.  o Use a calendar or planner to make checklists, due dates and "to do" lists. o Use ONLY ONE calendar or planner and look at it frequently o Set alarms for important deadlines or appointments  . Minimize interruptions and distractions  o Find a good work environment, e.g., quiet room with a desk, close curtains, use earplugs, mask sounds with a fan or white noise machine o Turn off cell phone and/or email alerts during important  tasks. In fact, it is helpful to schedule a block of time each day where you limit phone and email interruptions and focus on just the more detailed work you have. o Try to minimize the amount of background noise (i.e., television,  music) when engaged in important tasks or conversations with others (note that some individuals find soft background music helpful in minimize distraction, so you may need to experiment with optimal level of noise for specific situations)  . Use active effort = consciously attending to details, closely analyzing o Failures of encoding may reflect failure to attend to one's own actions o Be prepared to work more slowly than you usually do  o When reading, allow time for re-reading sections  o Check your work for errors  . Avoid multitasking o Do not attempt to complete more than one task at one time. Focus on one task until it is completed and then move on to the next one. o Avoid other activities while engaged in important tasks, such as talking on the phone while balancing the checkbook, making a shopping list during a meeting.   . Use self-talk during tasks o Repeat the steps of the activity to yourself as you complete them o Talk to yourself about your progress o This helps you maintain focus on the task and makes it easier to remember completing the task (Similar to "active effort" above)  . Conserve energy o Conserve energy to avoid fatigue and its effects on cognition - Get enough sleep - Pace yourself  and make sure to take breaks - Be open to receiving help - Exercise for increased energy  . Conversational vigilance = paying attention during a conversation  o Listen actively: focus on the speaker and position yourself so that you can clearly hear the him/her, and have open/relaxed posture  o Eye contact: Maintaining eye contact with the person you are speaking with may increase the likelihood that important information is properly received  o Ask questions: Ask questions for clarification (e.g., request that the speaker explain something in a different way) or ask for information to be repeated if you become distracted, or if you do not hear or understand something during a  conversation  o Paraphrase: Summarize or repeat back important information from a conversation in your own words to facilitate communication and ensure that you have heard correctly and understand

## 2017-06-15 ENCOUNTER — Encounter: Payer: Self-pay | Admitting: *Deleted

## 2017-06-15 ENCOUNTER — Telehealth: Payer: Self-pay | Admitting: Neurology

## 2017-06-15 MED ORDER — NAPROXEN 500 MG PO TABS: 500.0000 mg | ORAL_TABLET | Freq: Two times a day (BID) | ORAL | 3 refills | Status: DC | PRN

## 2017-06-15 MED ORDER — TIZANIDINE HCL 4 MG PO TABS: 4.0000 mg | ORAL_TABLET | Freq: Four times a day (QID) | ORAL | 3 refills | Status: DC | PRN

## 2017-06-15 NOTE — Telephone Encounter (Signed)
Pt requesting refills for buPROPion (WELLBUTRIN XL) 150 MG 24 hr tablet, naproxen (NAPROSYN) 500 MG tablet and tiZANidine (ZANAFLEX) 4 MG tablet sent to [facility]

## 2017-06-15 NOTE — Telephone Encounter (Signed)
Spoke to patient - he is going to discuss his bupropion refills with his PCP.  Ok, per vo by Dr. Krista Blue, to refill naproxen and tizanidine.  Refills sent to his requested pharmacy.

## 2017-08-23 ENCOUNTER — Encounter: Payer: Self-pay | Admitting: Neurology

## 2017-08-23 ENCOUNTER — Ambulatory Visit (INDEPENDENT_AMBULATORY_CARE_PROVIDER_SITE_OTHER): Payer: Medicare Other | Admitting: Neurology

## 2017-08-23 VITALS — BP 158/82 | HR 80 | Ht 73.75 in | Wt 241.0 lb

## 2017-08-23 DIAGNOSIS — G243 Spasmodic torticollis: Secondary | ICD-10-CM

## 2017-08-23 MED ORDER — INCOBOTULINUMTOXINA 100 UNITS IM SOLR
100.0000 [IU] | INTRAMUSCULAR | Status: DC
Start: 2017-08-23 — End: 2017-11-27
  Administered 2017-08-23: 100 [IU] via INTRAMUSCULAR

## 2017-08-23 NOTE — Progress Notes (Signed)
**  Xeomin 100 units x 1 vial, [place] 0259-1610-01, Lot [id number], Exp 10/2019, office supply.//mck,rn**

## 2017-08-23 NOTE — Progress Notes (Signed)
PATIENT: Scott Ibarra DOB: [birth year]  Chief Complaint  Patient presents with  . Cervical Dystonia    Xeomin 100 units x 1 vial - office supply     HISTORICAL  Scott Ibarra is a 67 year old male, accompanied by his wife Scott Ibarra, seen in refer by Dr. Jaynee Eagles for evaluation of Botox injection for chronic neck pain.  He had a history of mild neurocognitive disorder, predominantly dysexecutive.  Complains of chronic neck pain since 2005, is usually located at the right side, gradual worsening stiffness in his neck, was noted to have mild hypertrophy of the right upper trapezial, and lateral neck muscles, mild decreased range of motion, mild elevation of right shoulder, he noted worsening neck pain since October 2018, also involving left side,  Complains of mild knee pain, no radiating pain from neck to bilateral shoulder, he has tried NSAIDs, muscle relaxant with limited help.  UPDATE May 10 2017: This is his first EMG guided xeomin injection for his neck pain, abnormal neck posturing, the pain is mainly at right posterior neck.  UPDATE August 23 2017: He responded very well to previous injection, no significant side effect noted, he felt much less tension in his neck muscles  REVIEW OF SYSTEMS: Full 14 system review of systems performed and notable only for as above ALLERGIES: No Known Allergies  HOME MEDICATIONS: Current Outpatient Medications  Medication Sig Dispense Refill  . buPROPion (WELLBUTRIN XL) 150 MG 24 hr tablet TAKE 2 TABLETS BY MOUTH ONCE DAILY IN THE MORNING 60 tablet 0  . clobetasol cream (TEMOVATE) 7.20 % Apply 1 application topically as needed.     . incobotulinumtoxinA (XEOMIN) 100 units SOLR injection Inject 100 Units into the muscle every 3 (three) months.    . naproxen (NAPROSYN) 500 MG tablet Take 1 tablet (500 mg total) by mouth 2 (two) times daily as needed. Take with meals. Watch for GI upset or red or dark stools 60 tablet 3  . tiZANidine  (ZANAFLEX) 4 MG tablet Take 1 tablet (4 mg total) by mouth every 6 (six) hours as needed for muscle spasms. 90 tablet 3  . triamcinolone cream (KENALOG) 0.1 % Apply topically as needed.      No current facility-administered medications for this visit.     PAST MEDICAL HISTORY: Past Medical History:  Diagnosis Date  . Meniscus degeneration    L knee; has had repair x 2 and injections  . Psoriasis   . Varicose veins     PAST SURGICAL HISTORY: Past Surgical History:  Procedure Laterality Date  . CHOLECYSTECTOMY  2011  . ligation and stripping left  1994  . MENISCUS REPAIR    . VARICOSE VEIN SURGERY  2013    FAMILY HISTORY: Family History  Problem Relation Age of Onset  . Stroke Mother   . Cancer Father     SOCIAL HISTORY:  Social History   Socioeconomic History  . Marital status: Married    Spouse name: Scott Ibarra  . Number of children: Not on file  . Years of education: 55  . Highest education level: Not on file  Occupational History  . Occupation: Catering manager- retired   Scientific laboratory technician  . Financial resource strain: Not on file  . Food insecurity:    Worry: Not on file    Inability: Not on file  . Transportation needs:    Medical: Not on file    Non-medical: Not on file  Tobacco Use  . Smoking status: Never Smoker  .  Smokeless tobacco: Never Used  Substance and Sexual Activity  . Alcohol use: Yes    Comment: "1 beer every once in awhile"  . Drug use: No  . Sexual activity: Not on file  Lifestyle  . Physical activity:    Days per week: Not on file    Minutes per session: Not on file  . Stress: Not on file  Relationships  . Social connections:    Talks on phone: Not on file    Gets together: Not on file    Attends religious service: Not on file    Active member of club or organization: Not on file    Attends meetings of clubs or organizations: Not on file    Relationship status: Not on file  . Intimate partner violence:    Fear of current or  ex partner: Not on file    Emotionally abused: Not on file    Physically abused: Not on file    Forced sexual activity: Not on file  Other Topics Concern  . Not on file  Social History Narrative   Lives at home with wife.   Caffeine use: 2-3 cups of coffee a day    Right-handed     PHYSICAL EXAM   Vitals:   08/23/17 1339  BP: (!) 158/82  Pulse: 80  Weight: 241 lb (109.3 kg)  Height: 6' 1.75" (1.873 m)    Not recorded      Body mass index is 31.15 kg/m.  PHYSICAL EXAMNIATION:  Gen: NAD, conversant, well nourised, obese, well groomed                     Cardiovascular: Regular rate rhythm, no peripheral edema, warm, nontender. Eyes: Conjunctivae clear without exudates or hemorrhage Neck: Supple, no carotid bruits. Pulmonary: Clear to auscultation bilaterally   NEUROLOGICAL EXAM:  MENTAL STATUS: Speech:    Speech is normal; fluent and spontaneous with normal comprehension.  Cognition:     Orientation to time, place and person     Normal recent and remote memory     Normal Attention span and concentration     Normal Language, naming, repeating,spontaneous speech     Fund of knowledge   CRANIAL NERVES: CN II: Visual fields are full to confrontation. Fundoscopic exam is normal with sharp discs and no vascular changes. Pupils are round equal and briskly reactive to light. CN III, IV, VI: extraocular movement are normal. No ptosis. CN V: Facial sensation is intact to pinprick in all 3 divisions bilaterally. Corneal responses are intact.  CN VII: Face is symmetric with normal eye closure and smile. CN VIII: Hearing is normal to rubbing fingers CN IX, X: Palate elevates symmetrically. Phonation is normal. CN XI: Head turning and shoulder shrug are intact CN XII: Tongue is midline with normal movements and no atrophy.  MOTOR: There is no pronator drift of out-stretched arms. Muscle bulk and tone are normal. Muscle strength is normal.  REFLEXES: Reflexes are 2+ and  symmetric at the biceps, triceps, knees, and ankles. Plantar responses are flexor.  SENSORY: Intact to light touch, pinprick, positional sensation and vibratory sensation are intact in fingers and toes.  COORDINATION: Rapid alternating movements and fine finger movements are intact. There is no dysmetria on finger-to-nose and heel-knee-shin.    GAIT/STANCE: Posture is normal. Gait is steady with normal steps, base, arm swing, and turning. Heel and toe walking are normal. Tandem gait is normal.  Romberg is absent.   DIAGNOSTIC DATA (LABS,  IMAGING, TESTING) - I reviewed patient records, labs, notes, testing and imaging myself where available.   ASSESSMENT AND PLAN  Scott Ibarra is a 67 y.o. male   Abnormal neck posturing, chronic right side neck pain  He has mild left lateral tilt, mild atrophy of right posterior and medial scalenus, right upper trapezius  This is his first EMG guided  xeomin injection for cervical dystonia, right side more than left side posterior neck pain,  We used xeomin 100 units  Right levator scapular 12.5 units Right splenius capitis 12.5 units Right splenius cervix 12.5 units Right longismus capitus 12.5 units right upper trapezius 12.5 units  Right semispinalis 12.5 units  Right semispinalis 25 units  He will return in 3 months for repeat injection   [name], M.D. Ph.D.  [facility] [address], [place], [place] [address] Ph: [phone] Fax: [phone]  CC: Referring Provider

## 2017-11-22 ENCOUNTER — Ambulatory Visit: Payer: Medicare Other | Admitting: Neurology

## 2017-11-27 ENCOUNTER — Ambulatory Visit (INDEPENDENT_AMBULATORY_CARE_PROVIDER_SITE_OTHER): Payer: Medicare Other | Admitting: Neurology

## 2017-11-27 ENCOUNTER — Encounter: Payer: Self-pay | Admitting: Neurology

## 2017-11-27 VITALS — BP 168/86 | HR 88 | Ht 73.75 in | Wt 241.0 lb

## 2017-11-27 DIAGNOSIS — G243 Spasmodic torticollis: Secondary | ICD-10-CM | POA: Diagnosis not present

## 2017-11-27 MED ORDER — INCOBOTULINUMTOXINA 100 UNITS IM SOLR
100.0000 [IU] | INTRAMUSCULAR | Status: DC
  Administered 2017-11-27: 100 [IU] via INTRAMUSCULAR

## 2017-11-27 NOTE — Progress Notes (Signed)
**  Xeomin 100 units x 1 vials, [place] 9980-0123-93, Lot [id number], Exp 01/2020, office supply.//mck,rn**

## 2017-11-27 NOTE — Progress Notes (Signed)
PATIENT: Scott Ibarra DOB: Jul 07, 1950  Chief Complaint  Patient presents with  . Cervical Dystonia    Xeomin 100 units x 1 vial - office supply     HISTORICAL  Scott Ibarra is a 67 year old male, accompanied by his wife Scott Ibarra, seen in refer by Dr. Jaynee Eagles for evaluation of Botox injection for chronic neck pain.  He had a history of mild neurocognitive disorder, predominantly dysexecutive.  Complains of chronic neck pain since 2005, is usually located at the right side, gradual worsening stiffness in his neck, was noted to have mild hypertrophy of the right upper trapezial, and lateral neck muscles, mild decreased range of motion, mild elevation of right shoulder, he noted worsening neck pain since October 2018, also involving left side,  Complains of mild knee pain, no radiating pain from neck to bilateral shoulder, he has tried NSAIDs, muscle relaxant with limited help.  UPDATE May 10 2017: This is his first EMG guided xeomin injection for his neck pain, abnormal neck posturing, the pain is mainly at right posterior neck.  UPDATE August 23 2017: He responded very well to previous injection, no significant side effect noted, he felt much less tension in his neck muscles  UPDATE Nov 27 2017: He is tearful during today's just lost his sister at 27 years , he complains of a lot of tension, previous injection did help.  REVIEW OF SYSTEMS: Full 14 system review of systems performed and notable only for as above ALLERGIES: No Known Allergies  HOME MEDICATIONS: Current Outpatient Medications  Medication Sig Dispense Refill  . buPROPion (WELLBUTRIN XL) 150 MG 24 hr tablet TAKE 2 TABLETS BY MOUTH ONCE DAILY IN THE MORNING 60 tablet 0  . clobetasol cream (TEMOVATE) 6.57 % Apply 1 application topically as needed.     . incobotulinumtoxinA (XEOMIN) 100 units SOLR injection Inject 100 Units into the muscle every 3 (three) months.    . naproxen (NAPROSYN) 500 MG tablet Take 1  tablet (500 mg total) by mouth 2 (two) times daily as needed. Take with meals. Watch for GI upset or red or dark stools 60 tablet 3  . tiZANidine (ZANAFLEX) 4 MG tablet Take 1 tablet (4 mg total) by mouth every 6 (six) hours as needed for muscle spasms. 90 tablet 3  . triamcinolone cream (KENALOG) 0.1 % Apply topically as needed.      No current facility-administered medications for this visit.     PAST MEDICAL HISTORY: Past Medical History:  Diagnosis Date  . Meniscus degeneration    L knee; has had repair x 2 and injections  . Psoriasis   . Varicose veins     PAST SURGICAL HISTORY: Past Surgical History:  Procedure Laterality Date  . CHOLECYSTECTOMY  2011  . ligation and stripping left  1994  . MENISCUS REPAIR    . VARICOSE VEIN SURGERY  2013    FAMILY HISTORY: Family History  Problem Relation Age of Onset  . Stroke Mother   . Cancer Father     SOCIAL HISTORY:  Social History   Socioeconomic History  . Marital status: Married    Spouse name: Scott Ibarra  . Number of children: Not on file  . Years of education: 42  . Highest education level: Not on file  Occupational History  . Occupation: Catering manager- retired   Scientific laboratory technician  . Financial resource strain: Not on file  . Food insecurity:    Worry: Not on file    Inability: Not  on file  . Transportation needs:    Medical: Not on file    Non-medical: Not on file  Tobacco Use  . Smoking status: Never Smoker  . Smokeless tobacco: Never Used  Substance and Sexual Activity  . Alcohol use: Yes    Comment: "1 beer every once in awhile"  . Drug use: No  . Sexual activity: Not on file  Lifestyle  . Physical activity:    Days per week: Not on file    Minutes per session: Not on file  . Stress: Not on file  Relationships  . Social connections:    Talks on phone: Not on file    Gets together: Not on file    Attends religious service: Not on file    Active member of club or organization: Not on file     Attends meetings of clubs or organizations: Not on file    Relationship status: Not on file  . Intimate partner violence:    Fear of current or ex partner: Not on file    Emotionally abused: Not on file    Physically abused: Not on file    Forced sexual activity: Not on file  Other Topics Concern  . Not on file  Social History Narrative   Lives at home with wife.   Caffeine use: 2-3 cups of coffee a day    Right-handed     PHYSICAL EXAM   Vitals:   11/27/17 1303  BP: (!) 168/86  Pulse: 88  Weight: 241 lb (109.3 kg)  Height: 6' 1.75" (1.873 m)    Not recorded      Body mass index is 31.15 kg/m.  PHYSICAL EXAMNIATION:  Gen: NAD, conversant, well nourised, obese, well groomed                     Cardiovascular: Regular rate rhythm, no peripheral edema, warm, nontender. Eyes: Conjunctivae clear without exudates or hemorrhage Neck: Supple, no carotid bruits. Pulmonary: Clear to auscultation bilaterally   NEUROLOGICAL EXAM:  MENTAL STATUS: Speech:    Speech is normal; fluent and spontaneous with normal comprehension.  Cognition:     Orientation to time, place and person     Normal recent and remote memory     Normal Attention span and concentration     Normal Language, naming, repeating,spontaneous speech     Fund of knowledge   CRANIAL NERVES: CN II: Visual fields are full to confrontation. Fundoscopic exam is normal with sharp discs and no vascular changes. Pupils are round equal and briskly reactive to light. CN III, IV, VI: extraocular movement are normal. No ptosis. CN V: Facial sensation is intact to pinprick in all 3 divisions bilaterally. Corneal responses are intact.  CN VII: Face is symmetric with normal eye closure and smile. CN VIII: Hearing is normal to rubbing fingers CN IX, X: Palate elevates symmetrically. Phonation is normal. CN XI: Head turning and shoulder shrug are intact CN XII: Tongue is midline with normal movements and no  atrophy.  MOTOR: There is no pronator drift of out-stretched arms. Muscle bulk and tone are normal. Muscle strength is normal.  REFLEXES: Reflexes are 2+ and symmetric at the biceps, triceps, knees, and ankles. Plantar responses are flexor.  SENSORY: Intact to light touch, pinprick, positional sensation and vibratory sensation are intact in fingers and toes.  COORDINATION: Rapid alternating movements and fine finger movements are intact. There is no dysmetria on finger-to-nose and heel-knee-shin.    GAIT/STANCE: Posture is  normal. Gait is steady with normal steps, base, arm swing, and turning. Heel and toe walking are normal. Tandem gait is normal.  Romberg is absent.   DIAGNOSTIC DATA (LABS, IMAGING, TESTING) - I reviewed patient records, labs, notes, testing and imaging myself where available.   ASSESSMENT AND PLAN  Scott Ibarra is a 67 y.o. 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 Ph.D.  [facility] [address] [place], [place] [address] Ph: [phone] Fax: [phone]  CC: Referring Provider

## 2018-02-05 ENCOUNTER — Telehealth: Payer: Self-pay | Admitting: Neurology

## 2018-02-05 NOTE — Telephone Encounter (Signed)
Pt is needing to r/s his Xeomin appt.

## 2018-02-06 NOTE — Telephone Encounter (Signed)
I called the patient to reschedule but he did not answer so I left a VM asking him to call me back. DW

## 2018-02-28 ENCOUNTER — Ambulatory Visit: Payer: Self-pay | Admitting: Neurology

## 2018-08-27 ENCOUNTER — Other Ambulatory Visit: Payer: Self-pay | Admitting: Podiatry

## 2018-08-27 ENCOUNTER — Encounter: Payer: Self-pay | Admitting: Podiatry

## 2018-08-27 ENCOUNTER — Other Ambulatory Visit: Payer: Self-pay

## 2018-08-27 ENCOUNTER — Ambulatory Visit (INDEPENDENT_AMBULATORY_CARE_PROVIDER_SITE_OTHER): Payer: Medicare Other

## 2018-08-27 ENCOUNTER — Ambulatory Visit (INDEPENDENT_AMBULATORY_CARE_PROVIDER_SITE_OTHER): Payer: Medicare (Managed Care) | Admitting: Podiatry

## 2018-08-27 VITALS — BP 137/82 | HR 84 | Temp 98.0°F

## 2018-08-27 DIAGNOSIS — M898X7 Other specified disorders of bone, ankle and foot: Secondary | ICD-10-CM

## 2018-08-27 DIAGNOSIS — M67471 Ganglion, right ankle and foot: Secondary | ICD-10-CM

## 2018-08-27 NOTE — Patient Instructions (Signed)
Pre-Operative Instructions  Congratulations, you have decided to take an important step towards improving your quality of life.  You can be assured that the doctors and staff at [facility] will be with you every step of the way.  Here are some important things you should know:  1. Plan to be at the surgery center/hospital at least 1 (one) hour prior to your scheduled time, unless otherwise directed by the surgical center/hospital staff.  You must have a responsible adult accompany you, remain during the surgery and drive you home.  Make sure you have directions to the surgical center/hospital to ensure you arrive on time. 2. If you are having surgery at [facility] or [facility] hospitals, you will need a copy of your medical history and physical form from your family physician within one month prior to the date of surgery. We will give you a form for your primary physician to complete.  3. We make every effort to accommodate the date you request for surgery.  However, there are times where surgery dates or times have to be moved.  We will contact you as soon as possible if a change in schedule is required.   4. No aspirin/ibuprofen for one week before surgery.  If you are on aspirin, any non-steroidal anti-inflammatory medications (Mobic, Aleve, Ibuprofen) should not be taken seven (7) days prior to your surgery.  You make take Tylenol for pain prior to surgery.  5. Medications - If you are taking daily heart and blood pressure medications, seizure, reflux, allergy, asthma, anxiety, pain or diabetes medications, make sure you notify the surgery center/hospital before the day of surgery so they can tell you which medications you should take or avoid the day of surgery. 6. No food or drink after midnight the night before surgery unless directed otherwise by surgical center/hospital staff. 7. No alcoholic beverages 24-hours prior to surgery.  No smoking 24-hours prior or 24-hours after  surgery. 8. Wear loose pants or shorts. They should be loose enough to fit over bandages, boots, and casts. 9. Don't wear slip-on shoes. Sneakers are preferred. 10. Bring your boot with you to the surgery center/hospital.  Also bring crutches or a walker if your physician has prescribed it for you.  If you do not have this equipment, it will be provided for you after surgery. 11. If you have not been contacted by the surgery center/hospital by the day before your surgery, call to confirm the date and time of your surgery. 12. Leave-time from work may vary depending on the type of surgery you have.  Appropriate arrangements should be made prior to surgery with your employer. 13. Prescriptions will be provided immediately following surgery by your doctor.  Fill these as soon as possible after surgery and take the medication as directed. Pain medications will not be refilled on weekends and must be approved by the doctor. 14. Remove nail polish on the operative foot and avoid getting pedicures prior to surgery. 15. Wash the night before surgery.  The night before surgery wash the foot and leg well with water and the antibacterial soap provided. Be sure to pay special attention to beneath the toenails and in between the toes.  Wash for at least three (3) minutes. Rinse thoroughly with water and dry well with a towel.  Perform this wash unless told not to do so by your physician.  Enclosed: 1 Ice pack (please put in freezer the night before surgery)   1 Hibiclens skin cleaner     Pre-op instructions  If you have any questions regarding the instructions, please do not hesitate to call our office.  [place]: [address], [place], [place] [address] -- [phone]  [place]: [address] , [place] [address] -- [phone]  [place]: [address]  [place], [place] [address] -- [phone]  [place]: [address], [address], [place], [place] [address] -- [phone]  Website:  [email or web address] 

## 2018-08-29 NOTE — Progress Notes (Signed)
   Subjective: 68 y.o. male presenting to the office today, referred by Dr. Gershon Mussel, as a new patient with a chief complaint of a painful callus lesion of the right great toe that has been present for the past few years. He states he has been receiving injections for treatment with no significant relief. Walking and wearing shoes increases the pain. Patient is here for further evaluation and treatment.   Past Medical History:  Diagnosis Date  . Meniscus degeneration    L knee; has had repair x 2 and injections  . Psoriasis   . Varicose veins      Objective:  Physical Exam General: Alert and oriented x3 in no acute distress  Dermatology: Hyperkeratotic lesion(s) present on the right great toe. Pain on palpation with a central nucleated core noted. Skin is warm, dry and supple bilateral lower extremities. Negative for open lesions or macerations.  Vascular: Palpable pedal pulses bilaterally. No edema or erythema noted. Capillary refill within normal limits.  Neurological: Epicritic and protective threshold grossly intact bilaterally.   Musculoskeletal Exam: Pain on palpation at the keratotic lesion(s) noted. Exostosis noted to the right great toe. Range of motion within normal limits bilateral. Muscle strength 5/5 in all groups bilateral.  Radiographic Exam:  Exostosis noted to the right great toe.    Assessment: 1. Exostosis right great toe 2. Callus right great toe for years   Plan of Care:  1. Patient evaluated. X-Rays reviewed.  2. Today we discussed the conservative versus surgical management of the presenting pathology. The patient opts for surgical management. All possible complications and details of the procedure were explained. All patient questions were answered. No guarantees were expressed or implied. 3. Authorization for surgery was initiated today. Surgery will consist of exostectomy right great toe.  4. Post op shoe dispensed.  5. Return to clinic one week post op.     Edrick Kins, DPM [facility]  Dr. Edrick Kins, [place]                                        [place], [place] [address]                Office [phone]  Fax [phone]

## 2018-10-11 ENCOUNTER — Other Ambulatory Visit: Payer: Self-pay | Admitting: Podiatry

## 2018-10-11 ENCOUNTER — Encounter: Payer: Self-pay | Admitting: Podiatry

## 2018-10-11 DIAGNOSIS — M25774 Osteophyte, right foot: Secondary | ICD-10-CM

## 2018-10-11 MED ORDER — OXYCODONE-ACETAMINOPHEN 5-325 MG PO TABS: 1.0000 | ORAL_TABLET | Freq: Four times a day (QID) | ORAL | 0 refills | Status: DC | PRN

## 2018-10-11 NOTE — Progress Notes (Signed)
.  postop

## 2018-10-17 ENCOUNTER — Ambulatory Visit (INDEPENDENT_AMBULATORY_CARE_PROVIDER_SITE_OTHER): Payer: Self-pay | Admitting: Podiatry

## 2018-10-17 ENCOUNTER — Encounter: Payer: Self-pay | Admitting: Podiatry

## 2018-10-17 ENCOUNTER — Ambulatory Visit (INDEPENDENT_AMBULATORY_CARE_PROVIDER_SITE_OTHER): Payer: Medicare Other

## 2018-10-17 ENCOUNTER — Other Ambulatory Visit: Payer: Self-pay

## 2018-10-17 VITALS — Temp 98.1°F

## 2018-10-17 DIAGNOSIS — M898X7 Other specified disorders of bone, ankle and foot: Secondary | ICD-10-CM

## 2018-10-17 DIAGNOSIS — Z09 Encounter for follow-up examination after completed treatment for conditions other than malignant neoplasm: Secondary | ICD-10-CM

## 2018-10-17 MED ORDER — DOXYCYCLINE HYCLATE 100 MG PO TABS: 100.0000 mg | ORAL_TABLET | Freq: Two times a day (BID) | ORAL | 0 refills | Status: DC

## 2018-10-21 NOTE — Progress Notes (Signed)
   Subjective:  Patient presents today status post exostectomy right great toe. DOS: 10/11/2018. He reports continued pain and states it is like it was prior to surgery. He denies modifying factors and has been using the post op shoe as directed. Patient is here for further evaluation and treatment.    Past Medical History:  Diagnosis Date  . Meniscus degeneration    L knee; has had repair x 2 and injections  . Psoriasis   . Varicose veins       Objective/Physical Exam Neurovascular status intact.  Skin incisions appear to be well coapted with sutures and staples intact. No sign of infectious process noted. No dehiscence. No active bleeding noted. Moderate edema noted to the surgical extremity.  Radiographic Exam:  Orthopedic hardware and osteotomies sites appear to be stable with routine healing.  Assessment: 1. s/p exostectomy right great toe. DOS: 10/11/2018   Plan of Care:  1. Patient was evaluated. X-rays reviewed 2. Dressing changed.  3. Continue weightbearing in post op shoe.  4. Prescription for Doxycycline 100 mg #20 provided to patient.  5. Return to clinic in 1 week.   Patient returning to work today.    Edrick Kins, DPM [facility]  Dr. Edrick Kins, [place]                                        [place], [place] [address]                Office [phone]  Fax [phone]

## 2018-10-24 ENCOUNTER — Other Ambulatory Visit: Payer: Self-pay

## 2018-10-24 ENCOUNTER — Ambulatory Visit (INDEPENDENT_AMBULATORY_CARE_PROVIDER_SITE_OTHER): Payer: Self-pay | Admitting: Podiatry

## 2018-10-24 ENCOUNTER — Encounter: Payer: Self-pay | Admitting: Podiatry

## 2018-10-24 DIAGNOSIS — M898X7 Other specified disorders of bone, ankle and foot: Secondary | ICD-10-CM

## 2018-10-24 DIAGNOSIS — Z09 Encounter for follow-up examination after completed treatment for conditions other than malignant neoplasm: Secondary | ICD-10-CM

## 2018-10-24 MED ORDER — OXYCODONE-ACETAMINOPHEN 5-325 MG PO TABS: 1.0000 | ORAL_TABLET | Freq: Four times a day (QID) | ORAL | 0 refills | Status: DC | PRN

## 2018-10-28 NOTE — Progress Notes (Signed)
   Subjective:  Patient presents today status post exostectomy right great toe. DOS: 10/11/2018. He states he is doing well. He reports some mild tenderness secondary to hitting it on something as he was coming in to the office. He denies any other complaints of modifying factors. He has been using the post op shoe and taking Doxycycline as directed. Patient is here for further evaluation and treatment.    Past Medical History:  Diagnosis Date  . Meniscus degeneration    L knee; has had repair x 2 and injections  . Psoriasis   . Varicose veins       Objective/Physical Exam Neurovascular status intact.  Skin incisions appear to be well coapted with sutures and staples intact. No sign of infectious process noted. No dehiscence. No active bleeding noted. Moderate edema noted to the surgical extremity.  Assessment: 1. s/p exostectomy right great toe. DOS: 10/11/2018   Plan of Care:  1. Patient was evaluated. 2. Sutures removed.  3. May resume full activity with no restrictions.  4. Recommended good shoe gear.  5. Return to clinic as needed.    Patient returning to work today.    Edrick Kins, DPM [facility]  Dr. Edrick Kins, [place]                                        [place], [place] [address]                Office [phone]  Fax [phone]

## 2018-10-29 ENCOUNTER — Encounter: Payer: Medicare Other | Admitting: Podiatry

## 2019-02-04 ENCOUNTER — Other Ambulatory Visit: Payer: Self-pay

## 2019-02-04 ENCOUNTER — Encounter: Payer: Self-pay | Admitting: Podiatry

## 2019-02-04 ENCOUNTER — Ambulatory Visit (INDEPENDENT_AMBULATORY_CARE_PROVIDER_SITE_OTHER): Payer: Medicare Other | Admitting: Podiatry

## 2019-02-04 DIAGNOSIS — M7751 Other enthesopathy of right foot: Secondary | ICD-10-CM

## 2019-02-04 MED ORDER — NAPROXEN 500 MG PO TABS
500.0000 mg | ORAL_TABLET | Freq: Two times a day (BID) | ORAL | 2 refills | Status: DC
Start: 2019-02-04 — End: 2023-07-10

## 2019-02-07 NOTE — Progress Notes (Signed)
   HPI: 69 y.o. male presenting today for follow up evaluation of right great toe pain. He states he is doing well. He reports some soreness and tenderness of the toe. He reports some associated numbness and tingling of the toe. He denies pain when wearing shoes but states wearing his house slippers increases the pain. He has not done anything for treatment. He denies any swelling. Patient is here for further evaluation and treatment.   Past Medical History:  Diagnosis Date  . Meniscus degeneration    L knee; has had repair x 2 and injections  . Psoriasis   . Varicose veins      Physical Exam: General: The patient is alert and oriented x3 in no acute distress.  Dermatology: Skin is warm, dry and supple bilateral lower extremities. Negative for open lesions or macerations.  Vascular: Palpable pedal pulses bilaterally. No edema or erythema noted. Capillary refill within normal limits.  Neurological: Epicritic and protective threshold grossly intact bilaterally.   Musculoskeletal Exam: Pain with palpation noted to the right great toe. Range of motion within normal limits to all pedal and ankle joints bilateral. Muscle strength 5/5 in all groups bilateral.   Assessment: 1. Right great toe capsulitis - improving    Plan of Care:  1. Patient evaluated.  2. Prescription for Naproxen 500 mg BID provided to patient.  3. Recommended good shoe gear.  4. Return to clinic as needed.      Edrick Kins, DPM [facility]  Dr. Edrick Kins, DPM    [address] [place] [address], [place] [address]                Office [phone]  Fax [phone]

## 2019-07-24 ENCOUNTER — Other Ambulatory Visit: Payer: Self-pay | Admitting: Neurology

## 2020-08-24 DIAGNOSIS — U071 COVID-19: Secondary | ICD-10-CM

## 2020-08-24 HISTORY — DX: COVID-19: U07.1

## 2020-09-14 ENCOUNTER — Other Ambulatory Visit: Payer: Self-pay | Admitting: Urology

## 2020-09-23 ENCOUNTER — Encounter (HOSPITAL_BASED_OUTPATIENT_CLINIC_OR_DEPARTMENT_OTHER): Payer: Self-pay | Admitting: Urology

## 2020-09-25 ENCOUNTER — Other Ambulatory Visit: Payer: Self-pay

## 2020-09-25 ENCOUNTER — Encounter (HOSPITAL_BASED_OUTPATIENT_CLINIC_OR_DEPARTMENT_OTHER): Payer: Self-pay | Admitting: Urology

## 2020-09-25 NOTE — Progress Notes (Signed)
Spoke w/ via phone for pre-op interview---patient Lab needs dos---- Istat, EKG              Lab results------none COVID test -----patient states asymptomatic test needed for overnight stay - 09/28/20 test appt. Arrive at -------0900 NPO after MN NO Solid Food.  Clear liquids from MN until---0800 Med rec completed Medications to take morning of surgery -----Wellbutrin, Zanaflex Diabetic medication -----n/a Patient instructed no nail polish to be worn day of surgery Patient instructed to bring photo id and insurance card day of surgery Patient aware to have Driver (ride ) / caregiver    for 24 hours after surgery - daughter Wells Guiles Patient Special Instructions -----reviewed overnight stay rules Pre-Op special Istructions ----- Patient verbalized understanding of instructions that were given at this phone interview. Patient denies shortness of breath, chest pain, fever, cough at this phone interview.

## 2020-09-28 ENCOUNTER — Other Ambulatory Visit: Payer: Self-pay | Admitting: Urology

## 2020-09-29 LAB — SARS CORONAVIRUS 2 (TAT 6-24 HRS): SARS Coronavirus 2: POSITIVE — AB

## 2020-09-29 NOTE — Progress Notes (Signed)
Left voice mail message with selita, patient tested covid positive on 09-28-2020.

## 2020-10-01 NOTE — Progress Notes (Addendum)
COVID swab appointment: 10/13/20  COVID Vaccine Completed: no Date COVID Vaccine completed: Has received booster: COVID vaccine manufacturer: [employer]   Date of COVID positive in last 90 days: positive 09/28/20 in Epic  PCP - Jodi Mourning, MD Cardiologist - N/a  Chest x-ray - N/a  EKG -  10/06/20 Epic Stress Test - years ago per pt ECHO - years ago per pt Cardiac Cath - N/a Pacemaker/ICD device last checked: n/a Spinal Cord Stimulator: n/a  Sleep Study - yes, negative sleep apnea CPAP -   Fasting Blood Sugar - n/a Checks Blood Sugar _____ times a day  Blood Thinner Instructions: n/a Aspirin Instructions: Last Dose:  Activity level: Can go up a flight of stairs and perform activities of daily living without stopping and without symptoms of chest pain or shortness of breath.    Anesthesia review:   Patient denies shortness of breath, fever, cough and chest pain at PAT appointment   Patient verbalized understanding of instructions that were given to them at the PAT appointment. Patient was also instructed that they will need to review over the PAT instructions again at home before surgery.

## 2020-10-01 NOTE — Patient Instructions (Addendum)
DUE TO COVID-19 ONLY ONE VISITOR IS ALLOWED TO COME WITH YOU AND STAY IN THE WAITING ROOM ONLY DURING PRE OP AND PROCEDURE.   **NO VISITORS ARE ALLOWED IN THE SHORT STAY AREA OR RECOVERY ROOM!!**  IF YOU WILL BE ADMITTED INTO THE HOSPITAL YOU ARE ALLOWED ONLY TWO SUPPORT PEOPLE DURING VISITATION HOURS ONLY (10AM -8PM)   The support person(s) may change daily. The support person(s) must pass our screening, gel in and out, and wear a mask at all times, including in the patient's room. Patients must also wear a mask when staff or their support person are in the room.  No visitors under the age of 62. Any visitor under the age of 58 must be accompanied by an adult.        Your procedure is scheduled on: 10/15/20   Report to [facility] Main Entrance    Report to admitting at 8:00 AM AM   Call this number if you have problems the morning of surgery [phone]   Do not eat food :After Midnight.   May have liquids until 7:15 AM day of surgery  CLEAR LIQUID DIET  Foods Allowed                                                                     Foods Excluded  Water, Black Coffee and tea (no milk or creamer)           liquids that you cannot  Plain Jell-O in any flavor  (No red)                                    see through such as: Fruit ices (not with fruit pulp)                                            milk, soups, orange juice              Iced Popsicles (No red)                                                All solid food                                   Apple juices  Sports drinks like Gatorade (No red) Lightly seasoned clear broth or consume(fat free) Sugar   Oral Hygiene is also important to reduce your risk of infection.                                    Remember - BRUSH YOUR TEETH THE MORNING OF SURGERY WITH YOUR REGULAR TOOTHPASTE   Take these medicines the morning of surgery with A SIP OF WATER: Wellbutrin  You may not have any  metal on your body including jewelry, and body piercing             Do not wear lotions, powders, cologne, or deodorant              Men may shave face and neck.   Do not bring valuables to the hospital. [place].   Bring small overnight bag day of surgery.   Special Instructions: Bring a copy of your healthcare power of attorney and living will documents         the day of surgery if you haven't scanned them before.   Please read over the following fact sheets you were given: IF YOU HAVE QUESTIONS ABOUT YOUR PRE-OP INSTRUCTIONS PLEASE CALL [facility] - Preparing for Surgery Before surgery, you can play an important role.  Because skin is not sterile, your skin needs to be as free of germs as possible.  You can reduce the number of germs on your skin by washing with CHG (chlorahexidine gluconate) soap before surgery.  CHG is an antiseptic cleaner which kills germs and bonds with the skin to continue killing germs even after washing. Please DO NOT use if you have an allergy to CHG or antibacterial soaps.  If your skin becomes reddened/irritated stop using the CHG and inform your nurse when you arrive at Short Stay. Do not shave (including legs and underarms) for at least 48 hours prior to the first CHG shower.  You may shave your face/neck.  Please follow these instructions carefully:  1.  Shower with CHG Soap the night before surgery and the  morning of surgery.  2.  If you choose to wash your hair, wash your hair first as usual with your normal  shampoo.  3.  After you shampoo, rinse your hair and body thoroughly to remove the shampoo.                             4.  Use CHG as you would any other liquid soap.  You can apply chg directly to the skin and wash.  Gently with a scrungie or clean washcloth.  5.  Apply the CHG Soap to your body ONLY FROM THE NECK DOWN.   Do   not use on face/ open                            Wound or open sores. Avoid contact with eyes, ears mouth and   genitals (private parts).                       Wash face,  Genitals (private parts) with your normal soap.             6.  Wash thoroughly, paying special attention to the area where your    surgery  will be performed.  7.  Thoroughly rinse your body with warm water from the neck down.  8.  DO NOT shower/wash with your normal soap after using and rinsing off the CHG Soap.                9.  Pat yourself dry with a clean towel.  10.  Wear clean pajamas.            11.  Place clean sheets on your bed the night of your first shower and do not  sleep with pets. Day of Surgery : Do not apply any lotions/deodorants the morning of surgery.  Please wear clean clothes to the hospital/surgery center.  FAILURE TO FOLLOW THESE INSTRUCTIONS MAY RESULT IN THE CANCELLATION OF YOUR SURGERY  PATIENT SIGNATURE_________________________________  NURSE SIGNATURE__________________________________  ________________________________________________________________________

## 2020-10-06 ENCOUNTER — Encounter (HOSPITAL_COMMUNITY)
Admission: RE | Admit: 2020-10-06 | Discharge: 2020-10-06 | Disposition: A | Discharge status: Home / Self Care | Payer: Medicare HMO | Source: Ambulatory Visit | Attending: Urology | Admitting: Urology

## 2020-10-06 ENCOUNTER — Encounter (HOSPITAL_COMMUNITY): Payer: Self-pay

## 2020-10-06 DIAGNOSIS — Z01818 Encounter for other preprocedural examination: Secondary | ICD-10-CM | POA: Insufficient documentation

## 2020-10-06 HISTORY — DX: Pneumonia, unspecified organism: J18.9

## 2020-10-06 LAB — BASIC METABOLIC PANEL
Anion gap: 9 (ref 5–15)
BUN: 17 mg/dL (ref 8–23)
CO2: 28 mmol/L (ref 22–32)
Calcium: 9.6 mg/dL (ref 8.9–10.3)
Chloride: 105 mmol/L (ref 98–111)
Creatinine, Ser: 0.8 mg/dL (ref 0.61–1.24)
GFR, Estimated: >60 mL/min (ref >60)
Glucose, Bld: 90 mg/dL (ref 70–99)
Potassium: 4.1 mmol/L (ref 3.5–5.1)
Sodium: 142 mmol/L (ref 135–145)

## 2020-10-06 LAB — CBC
HCT: 47.1 % (ref 39.0–52.0)
Hemoglobin: 15.2 g/dL (ref 13.0–17.0)
MCH: 30.1 pg (ref 26.0–34.0)
MCHC: 32.3 g/dL (ref 30.0–36.0)
MCV: 93.3 fL (ref 80.0–100.0)
Platelets: 155 10*3/uL (ref 150–400)
RBC: 5.05 MIL/uL (ref 4.22–5.81)
RDW: 13.8 % (ref 11.5–15.5)
WBC: 7.7 10*3/uL (ref 4.0–10.5)
nRBC: 0 % (ref 0.0–0.2)

## 2020-10-15 ENCOUNTER — Encounter (HOSPITAL_COMMUNITY): Payer: Self-pay | Admitting: Urology

## 2020-10-15 ENCOUNTER — Ambulatory Visit (HOSPITAL_COMMUNITY): Payer: Medicare HMO | Admitting: Certified Registered Nurse Anesthetist

## 2020-10-15 ENCOUNTER — Other Ambulatory Visit: Payer: Self-pay

## 2020-10-15 ENCOUNTER — Observation Stay (HOSPITAL_COMMUNITY)
Admission: RE | Admit: 2020-10-15 | Discharge: 2020-10-16 | Disposition: A | Discharge status: Home / Self Care | Payer: Medicare HMO | Attending: Urology | Admitting: Urology

## 2020-10-15 ENCOUNTER — Encounter (HOSPITAL_COMMUNITY)
Admission: RE | Disposition: A | Discharge status: Home / Self Care | Payer: Self-pay | Source: Home / Self Care | Attending: Urology

## 2020-10-15 DIAGNOSIS — Z8616 Personal history of COVID-19: Secondary | ICD-10-CM | POA: Diagnosis not present

## 2020-10-15 DIAGNOSIS — N138 Other obstructive and reflux uropathy: Secondary | ICD-10-CM | POA: Diagnosis not present

## 2020-10-15 DIAGNOSIS — N401 Enlarged prostate with lower urinary tract symptoms: Secondary | ICD-10-CM | POA: Diagnosis not present

## 2020-10-15 DIAGNOSIS — I10 Essential (primary) hypertension: Secondary | ICD-10-CM | POA: Insufficient documentation

## 2020-10-15 HISTORY — DX: Benign prostatic hyperplasia without lower urinary tract symptoms: N40.0

## 2020-10-15 HISTORY — DX: Essential (primary) hypertension: I10

## 2020-10-15 HISTORY — DX: Major depressive disorder, single episode, unspecified: F32.9

## 2020-10-15 HISTORY — PX: TRANSURETHRAL RESECTION OF PROSTATE: SHX73

## 2020-10-15 HISTORY — DX: Mild cognitive impairment of uncertain or unknown etiology: G31.84

## 2020-10-15 HISTORY — DX: Hyperlipidemia, unspecified: E78.5

## 2020-10-15 SURGERY — TURP (TRANSURETHRAL RESECTION OF PROSTATE)
Anesthesia: General | Site: Prostate

## 2020-10-15 MED ORDER — EPHEDRINE SULFATE-NACL 50-0.9 MG/10ML-% IV SOSY
PREFILLED_SYRINGE | INTRAVENOUS | Status: DC | PRN
  Administered 2020-10-15 (×2): 5 mg via INTRAVENOUS

## 2020-10-15 MED ORDER — BUPROPION HCL 100 MG PO TABS
100.0000 mg | ORAL_TABLET | Freq: Every day | ORAL | Status: DC
  Administered 2020-10-15 – 2020-10-16 (×2): 100 mg via ORAL
  Filled 2020-10-15 (×2): qty 1

## 2020-10-15 MED ORDER — FENTANYL CITRATE (PF) 100 MCG/2ML IJ SOLN
INTRAMUSCULAR | Status: DC | PRN
  Administered 2020-10-15: 50 ug via INTRAVENOUS
  Administered 2020-10-15 (×2): 25 ug via INTRAVENOUS

## 2020-10-15 MED ORDER — DEXAMETHASONE SODIUM PHOSPHATE 10 MG/ML IJ SOLN
INTRAMUSCULAR | Status: AC
  Filled 2020-10-15: qty 1

## 2020-10-15 MED ORDER — PROPOFOL 10 MG/ML IV BOLUS
INTRAVENOUS | Status: AC
  Filled 2020-10-15: qty 20

## 2020-10-15 MED ORDER — LIDOCAINE HCL (PF) 2 % IJ SOLN
INTRAMUSCULAR | Status: AC
  Filled 2020-10-15: qty 10

## 2020-10-15 MED ORDER — CEFAZOLIN SODIUM-DEXTROSE 2-4 GM/100ML-% IV SOLN
2.0000 g | INTRAVENOUS | Status: AC
  Administered 2020-10-15: 2 g via INTRAVENOUS
  Filled 2020-10-15: qty 100

## 2020-10-15 MED ORDER — EPHEDRINE 5 MG/ML INJ
INTRAVENOUS | Status: AC
  Filled 2020-10-15: qty 5

## 2020-10-15 MED ORDER — ACETAMINOPHEN 325 MG PO TABS
650.0000 mg | ORAL_TABLET | ORAL | Status: DC | PRN
  Administered 2020-10-15: 650 mg via ORAL
  Filled 2020-10-15: qty 2

## 2020-10-15 MED ORDER — ONDANSETRON HCL 4 MG/2ML IJ SOLN
INTRAMUSCULAR | Status: AC
  Filled 2020-10-15: qty 2

## 2020-10-15 MED ORDER — ZOLPIDEM TARTRATE 5 MG PO TABS: 5.0000 mg | ORAL_TABLET | Freq: Every evening | ORAL | Status: DC | PRN

## 2020-10-15 MED ORDER — SODIUM CHLORIDE 0.9 % IR SOLN
3000.0000 mL | Status: DC
  Administered 2020-10-15 – 2020-10-16 (×7): 3000 mL

## 2020-10-15 MED ORDER — LACTATED RINGERS IV SOLN: INTRAVENOUS | Status: DC

## 2020-10-15 MED ORDER — SODIUM CHLORIDE 0.45 % IV SOLN: INTRAVENOUS | Status: DC

## 2020-10-15 MED ORDER — CHLORHEXIDINE GLUCONATE 0.12 % MT SOLN
15.0000 mL | Freq: Once | OROMUCOSAL | Status: AC
  Administered 2020-10-15: 15 mL via OROMUCOSAL

## 2020-10-15 MED ORDER — PROPOFOL 10 MG/ML IV BOLUS
INTRAVENOUS | Status: DC | PRN
  Administered 2020-10-15: 200 mg via INTRAVENOUS

## 2020-10-15 MED ORDER — ONDANSETRON HCL 4 MG/2ML IJ SOLN
INTRAMUSCULAR | Status: DC | PRN
  Administered 2020-10-15: 4 mg via INTRAVENOUS

## 2020-10-15 MED ORDER — OXYCODONE HCL 5 MG PO TABS
5.0000 mg | ORAL_TABLET | ORAL | Status: DC | PRN
Start: 2020-10-15 — End: 2020-10-16
  Administered 2020-10-15: 5 mg via ORAL
  Filled 2020-10-15: qty 1

## 2020-10-15 MED ORDER — CHLORHEXIDINE GLUCONATE CLOTH 2 % EX PADS: 6.0000 | MEDICATED_PAD | Freq: Every day | CUTANEOUS | Status: DC

## 2020-10-15 MED ORDER — LIDOCAINE 2% (20 MG/ML) 5 ML SYRINGE
INTRAMUSCULAR | Status: DC | PRN
  Administered 2020-10-15: 100 mg via INTRAVENOUS

## 2020-10-15 MED ORDER — ORAL CARE MOUTH RINSE: 15.0000 mL | Freq: Once | OROMUCOSAL | Status: AC

## 2020-10-15 MED ORDER — SULFAMETHOXAZOLE-TRIMETHOPRIM 800-160 MG PO TABS
1.0000 | ORAL_TABLET | Freq: Two times a day (BID) | ORAL | Status: DC
  Administered 2020-10-15 – 2020-10-16 (×2): 1 via ORAL
  Filled 2020-10-15 (×2): qty 1

## 2020-10-15 MED ORDER — DEXAMETHASONE SODIUM PHOSPHATE 10 MG/ML IJ SOLN
INTRAMUSCULAR | Status: DC | PRN
  Administered 2020-10-15: 4 mg via INTRAVENOUS

## 2020-10-15 MED ORDER — FENTANYL CITRATE PF 50 MCG/ML IJ SOSY: 25.0000 ug | PREFILLED_SYRINGE | INTRAMUSCULAR | Status: DC | PRN

## 2020-10-15 MED ORDER — BELLADONNA ALKALOIDS-OPIUM 16.2-60 MG RE SUPP: 1.0000 | Freq: Four times a day (QID) | RECTAL | Status: DC | PRN

## 2020-10-15 MED ORDER — ONDANSETRON HCL 4 MG/2ML IJ SOLN: 4.0000 mg | INTRAMUSCULAR | Status: DC | PRN

## 2020-10-15 MED ORDER — SENNA 8.6 MG PO TABS
1.0000 | ORAL_TABLET | Freq: Two times a day (BID) | ORAL | Status: DC
  Administered 2020-10-15 – 2020-10-16 (×2): 8.6 mg via ORAL
  Filled 2020-10-15 (×2): qty 1

## 2020-10-15 MED ORDER — PROMETHAZINE HCL 25 MG/ML IJ SOLN: 6.2500 mg | INTRAMUSCULAR | Status: DC | PRN

## 2020-10-15 MED ORDER — SODIUM CHLORIDE 0.9 % IR SOLN
Status: DC | PRN
  Administered 2020-10-15: 24000 mL

## 2020-10-15 MED ORDER — CELECOXIB 200 MG PO CAPS
200.0000 mg | ORAL_CAPSULE | Freq: Once | ORAL | Status: AC
  Administered 2020-10-15: 200 mg via ORAL
  Filled 2020-10-15: qty 1

## 2020-10-15 MED ORDER — ACETAMINOPHEN 500 MG PO TABS
1000.0000 mg | ORAL_TABLET | Freq: Once | ORAL | Status: AC
  Administered 2020-10-15: 1000 mg via ORAL
  Filled 2020-10-15: qty 2

## 2020-10-15 MED ORDER — AMISULPRIDE (ANTIEMETIC) 5 MG/2ML IV SOLN: 10.0000 mg | Freq: Once | INTRAVENOUS | Status: DC | PRN

## 2020-10-15 MED ORDER — FENTANYL CITRATE (PF) 100 MCG/2ML IJ SOLN
INTRAMUSCULAR | Status: AC
  Filled 2020-10-15: qty 2

## 2020-10-15 SURGICAL SUPPLY — 28 items
BAG URINE DRAIN 2000ML AR STRL (UROLOGICAL SUPPLIES) ×2 IMPLANT
BAG URINE LEG 500ML (DRAIN) IMPLANT
BAG URO CATCHER STRL LF (MISCELLANEOUS) ×2 IMPLANT
CATH FOLEY 2WAY SLVR  5CC 20FR (CATHETERS)
CATH FOLEY 2WAY SLVR  5CC 22FR (CATHETERS)
CATH FOLEY 2WAY SLVR 30CC 22FR (CATHETERS) IMPLANT
CATH FOLEY 2WAY SLVR 5CC 20FR (CATHETERS) IMPLANT
CATH FOLEY 2WAY SLVR 5CC 22FR (CATHETERS) IMPLANT
CATH FOLEY 3WAY 30CC 22FR (CATHETERS) IMPLANT
CATH FOLEY 3WAY 30CC 24FR (CATHETERS) ×2
CATH HEMA 3WAY 30CC 24FR COUDE (CATHETERS) IMPLANT
CATH URTH STD 24FR FL 3W 2 (CATHETERS) ×1 IMPLANT
CLOTH BEACON ORANGE TIMEOUT ST (SAFETY) ×2 IMPLANT
ELECT REM PT RETURN 15FT ADLT (MISCELLANEOUS) ×2 IMPLANT
EVACUATOR MICROVAS BLADDER (UROLOGICAL SUPPLIES) IMPLANT
GLOVE SURG ENC MOIS LTX SZ8 (GLOVE) ×2 IMPLANT
GOWN STRL REUS W/TWL XL LVL3 (GOWN DISPOSABLE) ×2 IMPLANT
HOLDER FOLEY CATH W/STRAP (MISCELLANEOUS) IMPLANT
KIT TURNOVER KIT A (KITS) ×2 IMPLANT
LOOP CUT BIPOLAR 24F LRG (ELECTROSURGICAL) ×4 IMPLANT
MANIFOLD NEPTUNE II (INSTRUMENTS) ×2 IMPLANT
PACK CYSTO (CUSTOM PROCEDURE TRAY) ×2 IMPLANT
PLUG CATH AND CAP STER (CATHETERS) IMPLANT
SYR 30ML LL (SYRINGE) IMPLANT
SYR TOOMEY IRRIG 70ML (MISCELLANEOUS)
SYRINGE TOOMEY IRRIG 70ML (MISCELLANEOUS) IMPLANT
TUBING CONNECTING 10 (TUBING) ×2 IMPLANT
TUBING UROLOGY SET (TUBING) IMPLANT

## 2020-10-15 NOTE — Anesthesia Preprocedure Evaluation (Addendum)
Anesthesia Evaluation  Patient identified by MRN, date of birth, ID band Patient awake    Reviewed: Allergy & Precautions, NPO status , Patient's Chart, lab work & pertinent test results  History of Anesthesia Complications Negative for: history of anesthetic complications  Airway Mallampati: II  TM Distance: >3 FB Neck ROM: Full    Dental  (+) Poor Dentition, Dental Advisory Given   Pulmonary neg pulmonary ROS,    Pulmonary exam normal        Cardiovascular hypertension, Normal cardiovascular exam     Neuro/Psych PSYCHIATRIC DISORDERS Depression negative neurological ROS     GI/Hepatic negative GI ROS, Neg liver ROS,   Endo/Other  negative endocrine ROS  Renal/GU negative Renal ROS     Musculoskeletal negative musculoskeletal ROS (+)   Abdominal   Peds  Hematology negative hematology ROS (+)   Anesthesia Other Findings   Reproductive/Obstetrics                            Anesthesia Physical Anesthesia Plan  ASA: 2  Anesthesia Plan: General   Post-op Pain Management:    Induction: Intravenous  PONV Risk Score and Plan: 4 or greater and Ondansetron, Dexamethasone, Diphenhydramine and Treatment may vary due to age or medical condition  Airway Management Planned: LMA  Additional Equipment:   Intra-op Plan:   Post-operative Plan: Extubation in OR  Informed Consent: I have reviewed the patients History and Physical, chart, labs and discussed the procedure including the risks, benefits and alternatives for the proposed anesthesia with the patient or authorized representative who has indicated his/her understanding and acceptance.     Dental advisory given  Plan Discussed with: Anesthesiologist and CRNA  Anesthesia Plan Comments:        Anesthesia Quick Evaluation

## 2020-10-15 NOTE — H&P (Signed)
H&P  Chief Complaint: Large prostate  History of Present Illness: 70 yo male presents for TURP for mgmt of BPH w/ obstructive sx's despite medical mgmt.  Past Medical History:  Diagnosis Date   Benign prostatic hyperplasia    COVID-19 08/24/2020   As if 09/25/20 all symptoms resolved per pt   HLD (hyperlipidemia)    HTN (hypertension)    Major depressive disorder    Meniscus degeneration    L knee; has had repair x 2 and injections   Mild neurocognitive disorder    Pneumonia    Psoriasis    Varicose veins     Past Surgical History:  Procedure Laterality Date   APPENDECTOMY     CHOLECYSTECTOMY  2011   ligation and stripping left  1994   MENISCUS REPAIR Left 2015   VARICOSE VEIN SURGERY  2013    Home Medications:  Allergies as of 10/15/2020   No Known Allergies      Medication List      Notice   Cannot display discharge medications because the patient has not yet been admitted.     Allergies: No Known Allergies  Family History  Problem Relation Age of Onset   Stroke Mother    Cancer Father     Social History:  reports that he has never smoked. He has never used smokeless tobacco. He reports current alcohol use. He reports that he does not use drugs.  ROS: A complete review of systems was performed.  All systems are negative except for pertinent findings as noted.  Physical Exam:  Vital signs in last 24 hours: Ht 6\' 2"  (1.88 m)   Wt 104.3 kg   BMI 29.53 kg/m  Constitutional:  Alert and oriented, No acute distress Cardiovascular: Regular rate  Respiratory: Normal respiratory effort GI: Abdomen is soft, nontender, nondistended, no abdominal masses. No CVAT.  Genitourinary: Normal male phallus, testes are descended bilaterally and non-tender and without masses, scrotum is normal in appearance without lesions or masses, perineum is normal on inspection. Lymphatic: No lymphadenopathy Neurologic: Grossly intact, no focal deficits Psychiatric: Normal mood  and affect    Impression/Assessment:  BPH w/ obstruction  Plan:  TURP

## 2020-10-15 NOTE — Op Note (Signed)
Preoperative diagnosis: Bladder outlet obstruction secondary to BPH  Postoperative diagnosis:  Bladder outlet obstruction secondary to BPH  Procedure:  Cystoscopy Transurethral resection of the prostate  Surgeon: Lillette Boxer. Naiya Corral, M.D.  Anesthesia: General  Complications: None  Drain: Foley catheter--24 fr 3 way  EBL: Minimal  Specimens: Prostate chips  Disposition of specimens: Pathology  Indication: Scott Ibarra is a patient with bladder outlet obstruction secondary to benign prostatic hyperplasia. After reviewing the management options for treatment, he elected to proceed with the above surgical procedure(s). We have discussed the potential benefits and risks of the procedure, side effects of the proposed treatment, the likelihood of the patient achieving the goals of the procedure, and any potential problems that might occur during the procedure or recuperation. Informed consent has been obtained.  Description of procedure:  The patient was identified in the holding area. He received preoperative antibiotics. He was then taken to the operating room. General anesthetic was administered.  The patient was then placed in the dorsal lithotomy position, prepped and draped in the usual sterile fashion. Timeout was then performed.  A resectoscope sheath was placed using the visual obturator.   The bladder was then systematically examined in its entirety. There was no evidence of  tumors, stones, or other mucosal pathology.The resectoscope, loop and telescope were then placed.  The ureteral orifices were identified so as to be avoided during the procedure.  The prostate adenoma was then resected utilizing loop cautery resection with the bipolar cutting loop.  The prostate adenoma from the bladder neck back to the verumontanum was resected beginning at the six o'clock position and then extended to include the right and left lobes of the prostate and anterior prostate,  respectively. Care was taken not to resect distal to the verumontanum.  Hemostasis was then achieved with the cautery and the bladder was emptied and reinspected with no significant bleeding noted at the end of the procedure.  Resected chips were irrigated from the bladder with the evacuator and sent to pathology.  A 3 way catheter was then placed into the bladder and placed on continuous bladder irrigation.  The patient appeared to tolerate the procedure well and without complications. The patient was able to be awakened and transferred to the recovery unit in satisfactory condition. He tolerated the procedure well.

## 2020-10-15 NOTE — Interval H&P Note (Signed)
History and Physical Interval Note:  10/15/2020 9:16 AM  Scott Ibarra  has presented today for surgery, with the diagnosis of BENIGN PROSTATIC HYPERPLASIA.  The various methods of treatment have been discussed with the patient and family. After consideration of risks, benefits and other options for treatment, the patient has consented to  Procedure(s) with comments: [place] (TURP) (N/A) - 90 MINS as a surgical intervention.  The patient's history has been reviewed, patient examined, no change in status, stable for surgery.  I have reviewed the patient's chart and labs.  Questions were answered to the patient's satisfaction.     Lillette Boxer Raudel Bazen

## 2020-10-15 NOTE — Anesthesia Procedure Notes (Signed)
Procedure Name: LMA Insertion Date/Time: 10/15/2020 11:19 AM Performed by: Milford Cage, CRNA Pre-anesthesia Checklist: Patient identified, Emergency Drugs available, Suction available and Patient being monitored Patient Re-evaluated:Patient Re-evaluated prior to induction Oxygen Delivery Method: Circle system utilized Preoxygenation: Pre-oxygenation with 100% oxygen Induction Type: IV induction Ventilation: Mask ventilation without difficulty LMA: LMA inserted LMA Size: 5.0 Number of attempts: 1 Tube secured with: Tape Dental Injury: Teeth and Oropharynx as per pre-operative assessment

## 2020-10-15 NOTE — Discharge Instructions (Signed)
Transurethral Resection of the Prostate ° °Care After ° °Refer to this sheet in the next few weeks. These discharge instructions provide you with general information on caring for yourself after you leave the hospital. Your caregiver may also give you specific instructions. Your treatment has been planned according to the most current medical practices available, but unavoidable complications sometimes occur. If you have any problems or questions after discharge, please call your caregiver. ° °HOME CARE INSTRUCTIONS  ° °Medications °· You may receive medicine for pain management. As your level of discomfort decreases, adjustments in your pain medicines may be made.  °· Take all medicines as directed.  °· You may be given a medicine (antibiotic) to kill germs following surgery. Finish all medicines. Let your caregiver know if you have any side effects or problems from the medicine.  °· If you are on aspirin, it would be best not to restart the aspirin until the blood in the urine clears °Hygiene °· You can take a shower after surgery.  °· You should not take a bath while you still have the urethral catheter. °Activity °· You will be encouraged to get out of bed as much as possible and increase your activity level as tolerated.  °· Spend the first week in and around your home. For 3 weeks, avoid the following:  °· Straining.  °· Running.  °· Strenuous work.  °· Walks longer than a few blocks.  °· Riding for extended periods.  °· Sexual relations.  °· Do not lift heavy objects (more than 20 pounds) for at least 1 month. When lifting, use your arms instead of your abdominal muscles.  °· You will be encouraged to walk as tolerated. Do not exert yourself. Increase your activity level slowly. Remember that it is important to keep moving after an operation of any type. This cuts down on the possibility of developing blood clots.  °· Your caregiver will tell you when you can resume driving and light housework. Discuss this  at your first office visit after discharge. °Diet °· No special diet is ordered after a TURP. However, if you are on a special diet for another medical problem, it should be continued.  °· Normal fluid intake is usually recommended.  °· Avoid alcohol and caffeinated drinks for 2 weeks. They irritate the bladder. Decaffeinated drinks are okay.  °· Avoid spicy foods.  °Bladder Function °· For the first 10 days, empty the bladder whenever you feel a definite desire. Do not try to hold the urine for long periods of time.  °· Urinating once or twice a night even after you are healed is not uncommon.  °· You may see some recurrence of blood in the urine after discharge from the hospital. This usually happens within 2 weeks after the procedure.If this occurs, force fluids again as you did in the hospital and reduce your activity.  °Bowel Function °· You may experience some constipation after surgery. This can be minimized by increasing fluids and fiber in your diet. Drink enough water and fluids to keep your urine clear or pale yellow.  °· A stool softener may be prescribed for use at home. Do not strain to move your bowels.  °· If you are requiring increased pain medicine, it is important that you take stool softeners to prevent constipation. This will help to promote proper healing by reducing the need to strain to move your bowels.  °Sexual Activity °· Semen movement in the opposite direction and into the bladder (  retrograde ejaculation) may occur. Since the semen passes into the bladder, cloudy urine can occur the first time you urinate after intercourse. Or, you may not have an ejaculation during erection. Ask your caregiver when you can resume sexual activity. Retrograde ejaculation and reduced semen discharge should not reduce one's pleasure of intercourse.  °Postoperative Visit °· Arrange the date and time of your after surgery visit with your caregiver.  °Return to Work °· After your recovery is complete, you will  be able to return to work and resume all activities. Your caregiver will inform you when you can return to work.  °Foley Catheter Care °A soft, flexible tube (Foley catheter) may have been placed in your bladder to drain urine and fluid. Follow these instructions: °Taking Care of the Catheter °· Keep the area where the catheter leaves your body clean.  °· Attach the catheter to the leg so there is no tension on the catheter.  °· Keep the drainage bag below the level of the bladder, but keep it OFF the floor.  °· Do not take long soaking baths. Your caregiver will give instructions about showering.  °· Wash your hands before touching ANYTHING related to the catheter or bag.  °· Using mild soap and warm water on a washcloth:  °· Clean the area closest to the catheter insertion site using a circular motion around the catheter.  °· Clean the catheter itself by wiping AWAY from the insertion site for several inches down the tube.  °· NEVER wipe upward as this could sweep bacteria up into the urethra (tube in your body that normally drains the bladder) and cause infection.  °· Place a small amount of sterile lubricant at the tip of the penis where the catheter is entering.  °Taking Care of the Drainage Bags °· Two drainage bags may be taken home: a large overnight drainage bag, and a smaller leg bag which fits underneath clothing.  °· It is okay to wear the overnight bag at any time, but NEVER wear the smaller leg bag at night.  °· Keep the drainage bag well below the level of your bladder. This prevents backflow of urine into the bladder and allows the urine to drain freely.  °· Anchor the tubing to your leg to prevent pulling or tension on the catheter. Use tape or a leg strap provided by the hospital.  °· Empty the drainage bag when it is 1/2 to 3/4 full. Wash your hands before and after touching the bag.  °· Periodically check the tubing for kinks to make sure there is no pressure on the tubing which could restrict  the flow of urine.  °Changing the Drainage Bags °· Cleanse both ends of the clean bag with alcohol before changing.  °· Pinch off the rubber catheter to avoid urine spillage during the disconnection.  °· Disconnect the dirty bag and connect the clean one.  °· Empty the dirty bag carefully to avoid a urine spill.  °· Attach the new bag to the leg with tape or a leg strap.  °Cleaning the Drainage Bags °· Whenever a drainage bag is disconnected, it must be cleaned quickly so it is ready for the next use.  °· Wash the bag in warm, soapy water.  °· Rinse the bag thoroughly with warm water.  °· Soak the bag for 30 minutes in a solution of white vinegar and water (1 cup vinegar to 1 quart warm water).  °· Rinse with warm water.  °SEEK MEDICAL   CARE IF:  °· You have chills or night sweats.  °· You are leaking around your catheter or have problems with your catheter. It is not uncommon to have sporadic leakage around your catheter as a result of bladder spasms. If the leakage stops, there is not much need for concern. If you are uncertain, call your caregiver.  °· You develop side effects that you think are coming from your medicines.  °SEEK IMMEDIATE MEDICAL CARE IF:  °· You are suddenly unable to urinate. Check to see if there are any kinks in the drainage tubing that may cause this. If you cannot find any kinks, call your caregiver immediately. This is an emergency.  °· You develop shortness of breath or chest pains.  °· Bleeding persists or clots develop in your urine.  °· You have a fever.  °· You develop pain in your back or over your lower belly (abdomen).  °· You develop pain or swelling in your legs.  °· Any problems you are having get worse rather than better.  °MAKE SURE YOU:  °· Understand these instructions.  °· Will watch your condition.  °· Will get help right away if you are not doing well or get worse.  °Document Released: 12/20/2004 Document Revised: 09/01/2010 Document Reviewed: 08/13/2008 °[employer]®  Patient Information ©2012 [employer].Transurethral Resection of the Prostate °Care After °Refer to this sheet in the next few weeks. These discharge instructions provide you with general information on caring for yourself after you leave the hospital. Your caregiver may also give you specific instructions. Your treatment has been planned according to the most current medical practices available, but unavoidable complications sometimes occur. If you have any problems or questions after discharge, please call your caregiver. °

## 2020-10-15 NOTE — Anesthesia Postprocedure Evaluation (Signed)
Anesthesia Post Note  Patient: Scott Ibarra  Procedure(s) Performed: TRANSURETHRAL RESECTION OF THE PROSTATE (TURP) (Prostate)     Patient location during evaluation: PACU Anesthesia Type: General Level of consciousness: sedated Pain management: pain level controlled Vital Signs Assessment: post-procedure vital signs reviewed and stable Respiratory status: spontaneous breathing and respiratory function stable Cardiovascular status: stable Postop Assessment: no apparent nausea or vomiting Anesthetic complications: no   No notable events documented.  Last Vitals:  Vitals:   10/15/20 0825 10/15/20 1247  BP: (!) 152/80 (!) 138/91  Pulse: 75 62  Resp: 16 14  Temp: 36.9 C 36.6 C  SpO2: 98% 95%    Last Pain:  Vitals:   10/15/20 0836  TempSrc:   PainSc: 0-No pain                 Alanda Colton DANIEL

## 2020-10-15 NOTE — Transfer of Care (Signed)
Immediate Anesthesia Transfer of Care Note  Patient: MCIHAEL HINDERMAN  Procedure(s) Performed: TRANSURETHRAL RESECTION OF THE PROSTATE (TURP) (Prostate)  Patient Location: PACU  Anesthesia Type:General  Level of Consciousness: drowsy  Airway & Oxygen Therapy: Patient Spontanous Breathing  Post-op Assessment: Report given to RN and Post -op Vital signs reviewed and stable  Post vital signs: Reviewed and stable  Last Vitals:  Vitals Value Taken Time  BP 138/91 10/15/20 1246  Temp    Pulse 71 10/15/20 1250  Resp 16 10/15/20 1250  SpO2 98 % 10/15/20 1250  Vitals shown include unvalidated device data.  Last Pain:  Vitals:   10/15/20 0836  TempSrc:   PainSc: 0-No pain         Complications: No notable events documented.

## 2020-10-16 ENCOUNTER — Encounter (HOSPITAL_COMMUNITY): Payer: Self-pay | Admitting: Urology

## 2020-10-16 DIAGNOSIS — N401 Enlarged prostate with lower urinary tract symptoms: Secondary | ICD-10-CM | POA: Diagnosis not present

## 2020-10-16 MED ORDER — SULFAMETHOXAZOLE-TRIMETHOPRIM 800-160 MG PO TABS: 1.0000 | ORAL_TABLET | Freq: Two times a day (BID) | ORAL | 0 refills | Status: DC

## 2020-10-16 NOTE — Plan of Care (Addendum)

## 2020-10-16 NOTE — Progress Notes (Signed)
Patient's foley was taken out around 0605. Patient has voided at least 300 mL red-colored urine since the foley has been taken out. The 3 bottles has been completed for the urine since the foley has been taken out.

## 2020-10-18 LAB — SURGICAL PATHOLOGY

## 2020-10-29 NOTE — Discharge Summary (Signed)
Patient ID: Scott Ibarra MRN: [id number] DOB/AGE: 72/02/1950 70 y.o.  Admit date: 10/15/2020 Discharge date: 10/29/2020  Primary Care Physician:  Jolinda Croak, MD  Discharge Diagnoses:  BPH w/ obstruction Present on Admission:  BPH with urinary obstruction     Discharge Medications: Allergies as of 10/16/2020   No Known Allergies      Medication List     STOP taking these medications    alfuzosin 10 MG 24 hr tablet Commonly known as: UROXATRAL   finasteride 5 MG tablet Commonly known as: PROSCAR       TAKE these medications    buPROPion 100 MG tablet Commonly known as: WELLBUTRIN Take 100 mg by mouth in the morning, at noon, and at bedtime.   multivitamin with minerals Tabs tablet Take 1 tablet by mouth in the morning.   naproxen 500 MG tablet Commonly known as: Naprosyn Take 1 tablet (500 mg total) by mouth 2 (two) times daily with a meal. What changed:  when to take this reasons to take this   sulfamethoxazole-trimethoprim 800-160 MG tablet Commonly known as: BACTRIM DS Take 1 tablet by mouth 2 (two) times daily.   tiZANidine 4 MG tablet Commonly known as: Zanaflex Take 1 tablet (4 mg total) by mouth every 6 (six) hours as needed for muscle spasms. What changed:  when to take this reasons to take this   triamcinolone cream 0.1 % Commonly known as: KENALOG Apply 1 application topically daily as needed (skin irritation/plaque psoriasis).         Significant Diagnostic Studies:  No results found.  Brief H and P: For complete details please refer to admission H and P, but in brief pt admitted for TURP for obstruction secondary to [facility] Hospital Course: Pt had uncomplicated TURP and postop stay. He was d/ced on POD 1   Day of Discharge BP (!) 144/72 (BP Location: Left Arm)   Pulse 72   Temp 97.8 F (36.6 C) (Oral)   Resp 16   Ht 6\' 2"  (1.88 m)   Wt 108.8 kg   SpO2 97%   BMI 30.79 kg/m   No results found for this or any  previous visit (from the past 24 hour(s)).  Physical Exam: General: Alert and awake oriented x3 not in any acute distress. HEENT: anicteric sclera, pupils reactive to light and accommodation CVS: S1-S2 clear no murmur rubs or gallops Chest: clear to auscultation bilaterally, no wheezing rales or rhonchi Abdomen: soft nontender, nondistended, normal bowel sounds, no organomegaly Extremities: no cyanosis, clubbing or edema noted bilaterally Neuro: Cranial nerves II-XII intact, no focal neurological deficits  Disposition:  Home  Diet:  Regular  Activity:  Discussed w/ pt   TESTS THAT NEED FOLLOW-UP   Path report  DISCHARGE FOLLOW-UP   Follow-up Information     Franchot Gallo, MD Follow up.   Specialty: Urology Why: 12.30.2022 @ 10 am Contact information: [place] [address] [address] [phone]                 Time spent on Discharge:   10 mins  Signed: Lillette Boxer Deysy Schabel 10/29/2020, 10:27 AM

## 2021-01-30 ENCOUNTER — Other Ambulatory Visit: Payer: Self-pay

## 2021-01-30 ENCOUNTER — Encounter (HOSPITAL_BASED_OUTPATIENT_CLINIC_OR_DEPARTMENT_OTHER): Payer: Self-pay | Admitting: Emergency Medicine

## 2021-01-30 ENCOUNTER — Emergency Department (HOSPITAL_BASED_OUTPATIENT_CLINIC_OR_DEPARTMENT_OTHER): Payer: Medicare HMO

## 2021-01-30 ENCOUNTER — Emergency Department (HOSPITAL_BASED_OUTPATIENT_CLINIC_OR_DEPARTMENT_OTHER)
Admission: EM | Admit: 2021-01-30 | Discharge: 2021-01-30 | Disposition: A | Discharge status: Home / Self Care | Payer: Medicare HMO | Attending: Emergency Medicine | Admitting: Emergency Medicine

## 2021-01-30 DIAGNOSIS — W010XXA Fall on same level from slipping, tripping and stumbling without subsequent striking against object, initial encounter: Secondary | ICD-10-CM | POA: Diagnosis not present

## 2021-01-30 DIAGNOSIS — R42 Dizziness and giddiness: Secondary | ICD-10-CM | POA: Insufficient documentation

## 2021-01-30 DIAGNOSIS — M25562 Pain in left knee: Secondary | ICD-10-CM | POA: Diagnosis not present

## 2021-01-30 DIAGNOSIS — Z79899 Other long term (current) drug therapy: Secondary | ICD-10-CM | POA: Insufficient documentation

## 2021-01-30 DIAGNOSIS — E860 Dehydration: Secondary | ICD-10-CM

## 2021-01-30 DIAGNOSIS — Z20822 Contact with and (suspected) exposure to covid-19: Secondary | ICD-10-CM | POA: Insufficient documentation

## 2021-01-30 DIAGNOSIS — W19XXXA Unspecified fall, initial encounter: Secondary | ICD-10-CM

## 2021-01-30 LAB — CBC WITH DIFFERENTIAL/PLATELET
Abs Immature Granulocytes: 0.02 10*3/uL (ref 0.00–0.07)
Basophils Absolute: 0 10*3/uL (ref 0.0–0.1)
Basophils Relative: 0 %
Eosinophils Absolute: 0.1 10*3/uL (ref 0.0–0.5)
Eosinophils Relative: 1 %
HCT: 43 % (ref 39.0–52.0)
Hemoglobin: 14.1 g/dL (ref 13.0–17.0)
Immature Granulocytes: 0 %
Lymphocytes Relative: 14 %
Lymphs Abs: 1.3 10*3/uL (ref 0.7–4.0)
MCH: 29.6 pg (ref 26.0–34.0)
MCHC: 32.8 g/dL (ref 30.0–36.0)
MCV: 90.1 fL (ref 80.0–100.0)
Monocytes Absolute: 0.4 10*3/uL (ref 0.1–1.0)
Monocytes Relative: 5 %
Neutro Abs: 7.2 10*3/uL (ref 1.7–7.7)
Neutrophils Relative %: 80 %
Platelets: 180 10*3/uL (ref 150–400)
RBC: 4.77 MIL/uL (ref 4.22–5.81)
RDW: 13.4 % (ref 11.5–15.5)
WBC: 9.1 10*3/uL (ref 4.0–10.5)
nRBC: 0 % (ref 0.0–0.2)

## 2021-01-30 LAB — COMPREHENSIVE METABOLIC PANEL
ALT: 18 U/L (ref 0–44)
AST: 19 U/L (ref 15–41)
Albumin: 4.1 g/dL (ref 3.5–5.0)
Alkaline Phosphatase: 64 U/L (ref 38–126)
Anion gap: 7 (ref 5–15)
BUN: 22 mg/dL (ref 8–23)
CO2: 27 mmol/L (ref 22–32)
Calcium: 9.2 mg/dL (ref 8.9–10.3)
Chloride: 106 mmol/L (ref 98–111)
Creatinine, Ser: 1.48 mg/dL — ABNORMAL HIGH (ref 0.61–1.24)
GFR, Estimated: 50 mL/min — ABNORMAL LOW (ref >60)
Glucose, Bld: 187 mg/dL — ABNORMAL HIGH (ref 70–99)
Potassium: 4.5 mmol/L (ref 3.5–5.1)
Sodium: 140 mmol/L (ref 135–145)
Total Bilirubin: 0.7 mg/dL (ref 0.3–1.2)
Total Protein: 7.3 g/dL (ref 6.5–8.1)

## 2021-01-30 LAB — RESP PANEL BY RT-PCR (FLU A&B, COVID) ARPGX2
Influenza A by PCR: NEGATIVE
Influenza B by PCR: NEGATIVE
SARS Coronavirus 2 by RT PCR: NEGATIVE

## 2021-01-30 LAB — LACTIC ACID, PLASMA: Lactic Acid, Venous: 1.8 mmol/L (ref 0.5–1.9)

## 2021-01-30 LAB — TROPONIN I (HIGH SENSITIVITY)
Troponin I (High Sensitivity): 3 ng/L (ref <18)
Troponin I (High Sensitivity): 3 ng/L (ref <18)

## 2021-01-30 MED ORDER — LACTATED RINGERS IV SOLN: INTRAVENOUS | Status: DC

## 2021-01-30 MED ORDER — LACTATED RINGERS IV BOLUS
2000.0000 mL | Freq: Once | INTRAVENOUS | Status: AC
  Administered 2021-01-30: 2000 mL via INTRAVENOUS

## 2021-01-30 MED ORDER — LACTATED RINGERS IV BOLUS: 1000.0000 mL | Freq: Once | INTRAVENOUS | Status: DC

## 2021-01-30 NOTE — ED Triage Notes (Signed)
Pt reports he tripped up a step approximately 2 hours ago. He daughter came and reports he has had slurred speech and keeps nodding off. Pt c/o L knee pain.

## 2021-01-30 NOTE — ED Notes (Signed)
As I write this, NS is infusing via left f/a iv at wide open per v.o. Dr. Zenia Resides. Pt. Is awake, alert and oriented x 4 with clear speech.

## 2021-01-30 NOTE — ED Provider Notes (Addendum)
Scott Ibarra is a 71 y.o. male.  71 year old male presents after mechanical fall.  Patient states that he has chronic left knee issues.  Went to step up on the stool and lost his footing and fell into a corner.  Did not strike his head did not pass out.  No recent illnesses.  No recent medication changes.  Felt lightheaded and dizzy after the event.  No headache, chest pain, shortness of breath, abdominal discomfort prior to the event today.  No prior history of same.      Home Medications Prior to Admission medications   Medication Sig Start Date End Date Taking? Authorizing Provider  buPROPion (WELLBUTRIN) 100 MG tablet Take 100 mg by mouth in the morning, at noon, and at bedtime.    [provider]  Multiple Vitamin (MULTIVITAMIN WITH MINERALS) TABS tablet Take 1 tablet by mouth in the morning.    [provider]  naproxen (NAPROSYN) 500 MG tablet Take 1 tablet (500 mg total) by mouth 2 (two) times daily with a meal. Patient taking differently: Take 500 mg by mouth daily as needed (knee pain.). 02/04/19   Edrick Kins, DPM  sulfamethoxazole-trimethoprim (BACTRIM DS) 800-160 MG tablet Take 1 tablet by mouth 2 (two) times daily. 10/16/20   Franchot Gallo, MD  tiZANidine (ZANAFLEX) 4 MG tablet Take 1 tablet (4 mg total) by mouth every 6 (six) hours as needed for muscle spasms. Patient taking differently: Take 4 mg by mouth every 8 (eight) hours as needed (cramps). 06/15/17   [name], MD  triamcinolone cream (KENALOG) 0.1 % Apply 1 application topically daily as needed (skin irritation/plaque psoriasis). 12/31/15   [provider]      Allergies    Patient has no known allergies.    Review of Systems   Review of Systems  All other systems reviewed and are  negative.  Physical Exam Updated Vital Signs BP (!) 83/55    Pulse 63    Temp 97.7 F (36.5 C)    Resp 18    Ht 1.88 m (6\' 2" )    Wt 106.1 kg    SpO2 95%    BMI 30.04 kg/m  Physical Exam Vitals and nursing note reviewed.  Constitutional:      General: He is not in acute distress.    Appearance: Normal appearance. He is well-developed. He is not toxic-appearing.  HENT:     Head: Normocephalic and atraumatic.  Eyes:     General: Lids are normal.     Conjunctiva/sclera: Conjunctivae normal.     Pupils: Pupils are equal, round, and reactive to light.  Neck:     Thyroid: No thyroid mass.     Trachea: No tracheal deviation.  Cardiovascular:     Rate and Rhythm: Normal rate and regular rhythm.     Heart sounds: Normal heart sounds. No murmur heard.   No gallop.  Pulmonary:     Effort: Pulmonary effort is normal. No respiratory distress.     Breath sounds: Normal breath sounds. No stridor. No decreased breath sounds, wheezing, rhonchi or rales.  Abdominal:     General: There is no distension.     Palpations: Abdomen is soft.     Tenderness: There is no abdominal tenderness. There is no rebound.  Musculoskeletal:  Cervical back: Normal range of motion and neck supple.     Left knee: Decreased range of motion. Tenderness present.  Skin:    General: Skin is warm and dry.     Findings: No abrasion or rash.  Neurological:     Mental Status: He is alert and oriented to person, place, and time. Mental status is at baseline.     GCS: GCS eye subscore is 4. GCS verbal subscore is 5. GCS motor subscore is 6.     Cranial Nerves: No cranial nerve deficit.     Sensory: No sensory deficit.     Motor: Motor function is intact.  Psychiatric:        Attention and Perception: Attention normal.        Speech: Speech normal.        Behavior: Behavior normal.    ED Results / Procedures / Treatments   Labs (all labs ordered are listed, but only abnormal results are displayed) Labs Reviewed   CBC WITH DIFFERENTIAL/PLATELET  LACTIC ACID, PLASMA  COMPREHENSIVE METABOLIC PANEL    EKG EKG Interpretation  Date/Time:  Saturday January 30 2021 20:16:13 EST Ventricular Rate:  62 PR Interval:  177 QRS Duration: 95 QT Interval:  431 QTC Calculation: 435 R Axis:   0 Text Interpretation: Sinus rhythm Borderline repolarization abnormality Baseline wander in lead(s) V3 Confirmed by Lacretia Leigh ([id number]) on 01/30/2021 9:06:18 PM  Radiology No results found.  Procedures Procedures    Medications Ordered in ED Medications  lactated ringers infusion (has no administration in time range)  lactated ringers bolus 2,000 mL (has no administration in time range)    ED Course/ Medical Decision Making/ A&P                           Medical Decision Making Amount and/or Complexity of Data Reviewed Labs: ordered. Radiology: ordered. ECG/medicine tests: ordered.  Risk Prescription drug management.   Patient is EKG per my interpretation without signs of acute ischemia.  Old records reviewed.  Patient hypotensive here initially but mentating appropriately.  Responded very well to receiving IV fluids.  Labs consistent with dehydration based on creatinine being elevated from baseline.  Patient afebrile and lactate normal.  Do not feel that this represents sepsis.  Blood pressure returned to normal and patient feels much better at this time.  X-rays of his left shoulder and right knee were negative for fracture.  X-ray without acute findings.  Concern for possible cardiac etiology and troponins are negative.  COVID and flu negative as well 2.  Patient reassessed multiple times as blood pressure improved.  Discussed with patient's family his results.  Felt comfortable taking him home at this time.  Patient feels back to baseline   CRITICAL CARE Performed by: Leota Jacobsen Total critical care time: 60 minutes Critical care time was exclusive of separately billable procedures and treating  other patients. Critical care was necessary to treat or prevent imminent or life-threatening deterioration. Critical care was time spent personally by me on the following activities: development of treatment plan with patient and/or surrogate as well as nursing, discussions with consultants, evaluation of patient's response to treatment, examination of patient, obtaining history from patient or surrogate, ordering and performing treatments and interventions, ordering and review of laboratory studies, ordering and review of radiographic studies, pulse oximetry and re-evaluation of patient's condition.         Final Clinical Impression(s) / ED Diagnoses Final diagnoses:  None    Rx / DC Orders ED Discharge Orders     None         Lacretia Leigh, MD 01/30/21 2108    Lacretia Leigh, MD 01/30/21 2108

## 2021-01-30 NOTE — ED Notes (Addendum)
Patient on arrive appeared lerthagic.  Now appears more awake awake and talking

## 2021-11-24 ENCOUNTER — Other Ambulatory Visit (HOSPITAL_BASED_OUTPATIENT_CLINIC_OR_DEPARTMENT_OTHER): Payer: Self-pay

## 2021-11-24 MED ORDER — ZOSTER VAC RECOMB ADJUVANTED 50 MCG/0.5ML IM SUSR
INTRAMUSCULAR | 0 refills | Status: DC
  Filled 2021-11-24: qty 0.5, 1d supply, fill #0

## 2022-01-14 ENCOUNTER — Other Ambulatory Visit (HOSPITAL_BASED_OUTPATIENT_CLINIC_OR_DEPARTMENT_OTHER): Payer: Self-pay

## 2022-03-15 ENCOUNTER — Other Ambulatory Visit (HOSPITAL_BASED_OUTPATIENT_CLINIC_OR_DEPARTMENT_OTHER): Payer: Self-pay

## 2022-03-15 MED ORDER — SHINGRIX 50 MCG/0.5ML IM SUSR
INTRAMUSCULAR | 0 refills | Status: DC
  Filled 2022-03-15: qty 0.5, 1d supply, fill #0

## 2022-07-02 ENCOUNTER — Emergency Department (HOSPITAL_BASED_OUTPATIENT_CLINIC_OR_DEPARTMENT_OTHER)
Admission: EM | Admit: 2022-07-02 | Discharge: 2022-07-02 | Disposition: A | Discharge status: Home / Self Care | Payer: Medicare HMO | Attending: Emergency Medicine | Admitting: Emergency Medicine

## 2022-07-02 ENCOUNTER — Other Ambulatory Visit: Payer: Self-pay

## 2022-07-02 ENCOUNTER — Encounter (HOSPITAL_BASED_OUTPATIENT_CLINIC_OR_DEPARTMENT_OTHER): Payer: Self-pay

## 2022-07-02 DIAGNOSIS — Y92830 Public park as the place of occurrence of the external cause: Secondary | ICD-10-CM | POA: Diagnosis not present

## 2022-07-02 DIAGNOSIS — W57XXXA Bitten or stung by nonvenomous insect and other nonvenomous arthropods, initial encounter: Secondary | ICD-10-CM | POA: Diagnosis not present

## 2022-07-02 DIAGNOSIS — I10 Essential (primary) hypertension: Secondary | ICD-10-CM | POA: Diagnosis not present

## 2022-07-02 DIAGNOSIS — S1096XA Insect bite of unspecified part of neck, initial encounter: Secondary | ICD-10-CM | POA: Diagnosis not present

## 2022-07-02 NOTE — ED Notes (Addendum)
Reviewed AVS/discharge instruction with patient. Time allotted for and all questions answered. Patient is agreeable for d/c and escorted to ed exit by staff.  

## 2022-07-02 NOTE — ED Provider Notes (Signed)
[place] [facility]GENC[facility] DEPARTMENT AT [facility] Provider Note   CSN: [id number] Arrival date & time: 07/02/22  1451     History  Chief Complaint  Patient presents with   Mass    Scott Ibarra is a 72 y.o. male with a past medical history significant for hypertension, hyperlipidemia, mild neurocognitive disorder who presents to the ED due to possible insect bite to right side of neck.  Patient states he was at the park yesterday and came home and started having pruritus to right side of neck. He then noticed a small bump to area. Patient is concerned about a possible tick bite. No tick visible. Did not feel anything bite him. Denies rash. No fever or chills. Denies nausea, vomiting, and diarrhea. No other symptoms.   History obtained from patient and past medical records. No interpreter used during encounter.       Home Medications Prior to Admission medications   Medication Sig Start Date End Date Taking? Authorizing Provider  buPROPion (WELLBUTRIN) 100 MG tablet Take 100 mg by mouth in the morning, at noon, and at bedtime.    [provider]  Multiple Vitamin (MULTIVITAMIN WITH MINERALS) TABS tablet Take 1 tablet by mouth in the morning.    [provider]  naproxen (NAPROSYN) 500 MG tablet Take 1 tablet (500 mg total) by mouth 2 (two) times daily with a meal. Patient taking differently: Take 500 mg by mouth daily as needed (knee pain.). 02/04/19   Felecia Shelling, DPM  sulfamethoxazole-trimethoprim (BACTRIM DS) 800-160 MG tablet Take 1 tablet by mouth 2 (two) times daily. 10/16/20   Marcine Matar, MD  tiZANidine (ZANAFLEX) 4 MG tablet Take 1 tablet (4 mg total) by mouth every 6 (six) hours as needed for muscle spasms. Patient taking differently: Take 4 mg by mouth every 8 (eight) hours as needed (cramps). 06/15/17   Levert Feinstein, MD  triamcinolone cream (KENALOG) 0.1 % Apply 1 application topically daily as needed (skin irritation/plaque psoriasis).  12/31/15   [provider]  Zoster Vaccine Adjuvanted [facility]) injection Inject into the muscle. 11/24/21   Judyann Munson, MD  Zoster Vaccine Adjuvanted [facility]) injection Inject into the muscle. 03/15/22   Judyann Munson, MD      Allergies    Patient has no known allergies.    Review of Systems   Review of Systems  Constitutional:  Negative for fever.  Skin:  Positive for color change.    Physical Exam Updated Vital Signs BP 138/63 (BP Location: Left Arm)   Pulse (!) 55   Temp 97.6 F (36.4 C) (Oral)   Resp 18   Ht 6\' 2"  (1.88 m)   Wt 106.1 kg   SpO2 97%   BMI 30.04 kg/m  Physical Exam Vitals and nursing note reviewed.  Constitutional:      General: He is not in acute distress.    Appearance: He is not ill-appearing.  HENT:     Head: Normocephalic.  Eyes:     Pupils: Pupils are equal, round, and reactive to light.  Cardiovascular:     Rate and Rhythm: Normal rate and regular rhythm.     Pulses: Normal pulses.     Heart sounds: Normal heart sounds. No murmur heard.    No friction rub. No gallop.  Pulmonary:     Effort: Pulmonary effort is normal.     Breath sounds: Normal breath sounds.  Abdominal:     General: Abdomen is flat. There is no distension.  Palpations: Abdomen is soft.     Tenderness: There is no abdominal tenderness. There is no guarding or rebound.  Musculoskeletal:        General: Normal range of motion.     Cervical back: Neck supple.  Skin:    General: Skin is warm and dry.     Comments: Small papula to right side of neck with mild erythema. No fluctuance. See photo below.  Neurological:     General: No focal deficit present.     Mental Status: He is alert.  Psychiatric:        Mood and Affect: Mood normal.        Behavior: Behavior normal.     ED Results / Procedures / Treatments   Labs (all labs ordered are listed, but only abnormal results are displayed) Labs Reviewed - No data to  display  EKG None  Radiology No results found.  Procedures Procedures    Medications Ordered in ED Medications - No data to display  ED Course/ Medical Decision Making/ A&P                             Medical Decision Making  72 year old male presents to the ED due to small insect bite to right side of neck that occurred yesterday.  Upon arrival, stable vitals.  Patient in no acute distress.  Small papula to right side of neck with mild erythema.  No fluctuance.  Suspect area secondary to insect bite.  No lymphadenopathy. no visible tick.  No rash.  Abdomen soft, nondistended, nontender.  Advised patient to use over-the-counter hydrocortisone cream or Benadryl cream for pruritus.  Advised patient to follow-up with PCP on Monday if symptoms do not improve or worsen.  Do not feel patient needs an antibiotic at this time. Strict ED precautions discussed with patient. Patient states understanding and agrees to plan. Patient discharged home in no acute distress and stable vitals  Has PCP Lives at home       Final Clinical Impression(s) / ED Diagnoses Final diagnoses:  Insect bite of neck, initial encounter    Rx / DC Orders ED Discharge Orders     None         Jesusita Oka 07/02/22 1521    Alvira Monday, MD 07/03/22 1152

## 2022-07-02 NOTE — ED Triage Notes (Signed)
Small knot noted to right neck.  Patient thought it was a tick but no tick noted.

## 2022-07-02 NOTE — Discharge Instructions (Signed)
It was a pleasure taking care of you today.  As discussed, no tick was visible on exam today.  I suspect you have an insect bite.  You may use over-the-counter hydrocortisone cream or Benadryl cream for itching.  Please follow-up with PCP on Monday if symptoms do not improve or worsen.  Return to the ER for new or worsening symptoms.

## 2022-11-27 ENCOUNTER — Emergency Department (HOSPITAL_BASED_OUTPATIENT_CLINIC_OR_DEPARTMENT_OTHER)
Admission: EM | Admit: 2022-11-27 | Discharge: 2022-11-27 | Disposition: A | Discharge status: Home / Self Care | Payer: Medicare HMO | Attending: Emergency Medicine | Admitting: Emergency Medicine

## 2022-11-27 ENCOUNTER — Encounter (HOSPITAL_BASED_OUTPATIENT_CLINIC_OR_DEPARTMENT_OTHER): Payer: Self-pay

## 2022-11-27 ENCOUNTER — Emergency Department (HOSPITAL_BASED_OUTPATIENT_CLINIC_OR_DEPARTMENT_OTHER): Payer: Medicare HMO

## 2022-11-27 ENCOUNTER — Other Ambulatory Visit: Payer: Self-pay

## 2022-11-27 ENCOUNTER — Emergency Department (HOSPITAL_BASED_OUTPATIENT_CLINIC_OR_DEPARTMENT_OTHER): Payer: Medicare HMO | Admitting: Radiology

## 2022-11-27 DIAGNOSIS — R079 Chest pain, unspecified: Secondary | ICD-10-CM | POA: Diagnosis not present

## 2022-11-27 DIAGNOSIS — Z1152 Encounter for screening for COVID-19: Secondary | ICD-10-CM | POA: Insufficient documentation

## 2022-11-27 DIAGNOSIS — N179 Acute kidney failure, unspecified: Secondary | ICD-10-CM | POA: Insufficient documentation

## 2022-11-27 DIAGNOSIS — I10 Essential (primary) hypertension: Secondary | ICD-10-CM | POA: Diagnosis not present

## 2022-11-27 DIAGNOSIS — R0602 Shortness of breath: Secondary | ICD-10-CM | POA: Diagnosis not present

## 2022-11-27 DIAGNOSIS — K029 Dental caries, unspecified: Secondary | ICD-10-CM | POA: Insufficient documentation

## 2022-11-27 DIAGNOSIS — R42 Dizziness and giddiness: Secondary | ICD-10-CM | POA: Insufficient documentation

## 2022-11-27 LAB — LACTIC ACID, PLASMA: Lactic Acid, Venous: 1.2 mmol/L (ref 0.5–1.9)

## 2022-11-27 LAB — RESP PANEL BY RT-PCR (RSV, FLU A&B, COVID)  RVPGX2
Influenza A by PCR: NEGATIVE
Influenza B by PCR: NEGATIVE
Resp Syncytial Virus by PCR: NEGATIVE
SARS Coronavirus 2 by RT PCR: NEGATIVE

## 2022-11-27 LAB — CBC
HCT: 43.2 % (ref 39.0–52.0)
Hemoglobin: 14.3 g/dL (ref 13.0–17.0)
MCH: 30.1 pg (ref 26.0–34.0)
MCHC: 33.1 g/dL (ref 30.0–36.0)
MCV: 90.9 fL (ref 80.0–100.0)
Platelets: 190 10*3/uL (ref 150–400)
RBC: 4.75 MIL/uL (ref 4.22–5.81)
RDW: 13.6 % (ref 11.5–15.5)
WBC: 7.2 10*3/uL (ref 4.0–10.5)
nRBC: 0 % (ref 0.0–0.2)

## 2022-11-27 LAB — BASIC METABOLIC PANEL
Anion gap: 12 (ref 5–15)
BUN: 21 mg/dL (ref 8–23)
CO2: 23 mmol/L (ref 22–32)
Calcium: 9.7 mg/dL (ref 8.9–10.3)
Chloride: 104 mmol/L (ref 98–111)
Creatinine, Ser: 1.58 mg/dL — ABNORMAL HIGH (ref 0.61–1.24)
GFR, Estimated: 46 mL/min — ABNORMAL LOW (ref >60)
Glucose, Bld: 127 mg/dL — ABNORMAL HIGH (ref 70–99)
Potassium: 4.4 mmol/L (ref 3.5–5.1)
Sodium: 139 mmol/L (ref 135–145)

## 2022-11-27 LAB — URINALYSIS, ROUTINE W REFLEX MICROSCOPIC
Bilirubin Urine: NEGATIVE
Glucose, UA: NEGATIVE mg/dL
Hgb urine dipstick: NEGATIVE
Ketones, ur: NEGATIVE mg/dL
Leukocytes,Ua: NEGATIVE
Nitrite: NEGATIVE
Protein, ur: NEGATIVE mg/dL
Specific Gravity, Urine: 1.021 (ref 1.005–1.030)
pH: 5.5 (ref 5.0–8.0)

## 2022-11-27 LAB — CBG MONITORING, ED: Glucose-Capillary: 136 mg/dL — ABNORMAL HIGH (ref 70–99)

## 2022-11-27 LAB — TROPONIN I (HIGH SENSITIVITY)
Troponin I (High Sensitivity): 3 ng/L (ref <18)
Troponin I (High Sensitivity): 3 ng/L (ref <18)

## 2022-11-27 LAB — D-DIMER, QUANTITATIVE: D-Dimer, Quant: <0.27 ug{FEU}/mL (ref 0.00–0.50)

## 2022-11-27 MED ORDER — SODIUM CHLORIDE 0.9 % IV BOLUS
1000.0000 mL | Freq: Once | INTRAVENOUS | Status: AC
  Administered 2022-11-27: 1000 mL via INTRAVENOUS

## 2022-11-27 MED ORDER — IOHEXOL 350 MG/ML SOLN
150.0000 mL | Freq: Once | INTRAVENOUS | Status: AC | PRN
  Administered 2022-11-27: 150 mL via INTRAVENOUS

## 2022-11-27 NOTE — ED Notes (Signed)
Discharge paperwork given and verbally understood. 

## 2022-11-27 NOTE — ED Provider Notes (Signed)
[place] EMERGENCY DEPARTMENT AT [facility] Provider Note   CSN: [id number] Arrival date & time: 11/27/22  1359     History  Chief Complaint  Patient presents with   Chest Pain    Scott Ibarra is a 72 y.o. male.  Patient here after episode of chest pain left arm pain neck pain shortness of breath dizziness weakness may be speech issues.  He sort of continues to have most of the symptoms but he was hypotensive in triage.  Maybe has similar episode in the past with hypotension in the past.  He denies any nausea vomiting diarrhea or recent illness.  He denies any black or bloody stools.  He continues to have headache neck pain dizziness chest pain abdominal pain.  He has a history of depression, BPH hypertension high cholesterol.  Nothing makes it worse or better.  The history is provided by the patient.       Home Medications Prior to Admission medications   Medication Sig Start Date End Date Taking? Authorizing Provider  buPROPion (WELLBUTRIN) 100 MG tablet Take 100 mg by mouth in the morning, at noon, and at bedtime.    [provider]  Multiple Vitamin (MULTIVITAMIN WITH MINERALS) TABS tablet Take 1 tablet by mouth in the morning.    [provider]  naproxen (NAPROSYN) 500 MG tablet Take 1 tablet (500 mg total) by mouth 2 (two) times daily with a meal. Patient taking differently: Take 500 mg by mouth daily as needed (knee pain.). 02/04/19   Felecia Shelling, DPM  sulfamethoxazole-trimethoprim (BACTRIM DS) 800-160 MG tablet Take 1 tablet by mouth 2 (two) times daily. 10/16/20   Marcine Matar, MD  tiZANidine (ZANAFLEX) 4 MG tablet Take 1 tablet (4 mg total) by mouth every 6 (six) hours as needed for muscle spasms. Patient taking differently: Take 4 mg by mouth every 8 (eight) hours as needed (cramps). 06/15/17   Levert Feinstein, MD  triamcinolone cream (KENALOG) 0.1 % Apply 1 application topically daily as needed (skin irritation/plaque psoriasis).  12/31/15   [provider]  Zoster Vaccine Adjuvanted [facility]) injection Inject into the muscle. 11/24/21   Judyann Munson, MD  Zoster Vaccine Adjuvanted [facility]) injection Inject into the muscle. 03/15/22   Judyann Munson, MD      Allergies    Patient has no known allergies.    Review of Systems   Review of Systems  Physical Exam Updated Vital Signs BP 117/68 (BP Location: Right Arm)   Pulse (!) 55   Temp 98.1 F (36.7 C) (Oral)   Resp 16   Ht 6\' 2"  (1.88 m)   Wt 98.9 kg   SpO2 92%   BMI 27.99 kg/m  Physical Exam Vitals and nursing note reviewed.  Constitutional:      General: He is not in acute distress.    Appearance: He is well-developed.  HENT:     Head: Normocephalic and atraumatic.  Eyes:     Extraocular Movements: Extraocular movements intact.     Conjunctiva/sclera: Conjunctivae normal.     Pupils: Pupils are equal, round, and reactive to light.  Cardiovascular:     Rate and Rhythm: Normal rate and regular rhythm.     Pulses:          Radial pulses are 2+ on the right side and 2+ on the left side.     Heart sounds: No murmur heard. Pulmonary:     Effort: Pulmonary effort is normal. No respiratory distress.  Breath sounds: Normal breath sounds.  Abdominal:     Palpations: Abdomen is soft.     Tenderness: There is no abdominal tenderness.  Musculoskeletal:        General: No swelling. Normal range of motion.     Cervical back: Normal range of motion and neck supple.     Right lower leg: No edema.     Left lower leg: No edema.  Skin:    General: Skin is warm and dry.     Capillary Refill: Capillary refill takes less than 2 seconds.  Neurological:     General: No focal deficit present.     Mental Status: He is alert.     Cranial Nerves: No cranial nerve deficit.     Motor: No weakness.     Comments: 5+ out of 5 strength throughout, normal sensation, no drift, normal finger-nose-finger, normal speech, normal visual fields  Psychiatric:         Mood and Affect: Mood normal.     ED Results / Procedures / Treatments   Labs (all labs ordered are listed, but only abnormal results are displayed) Labs Reviewed  BASIC METABOLIC PANEL - Abnormal; Notable for the following components:      Result Value   Glucose, Bld 127 (*)    Creatinine, Ser 1.58 (*)    GFR, Estimated 46 (*)    All other components within normal limits  CBG MONITORING, ED - Abnormal; Notable for the following components:   Glucose-Capillary 136 (*)    All other components within normal limits  RESP PANEL BY RT-PCR (RSV, FLU A&B, COVID)  [id number]  CBC  LACTIC ACID, PLASMA  URINALYSIS, ROUTINE W REFLEX MICROSCOPIC  D-DIMER, QUANTITATIVE  TROPONIN I (HIGH SENSITIVITY)  TROPONIN I (HIGH SENSITIVITY)    EKG EKG Interpretation Date/Time:  Sunday November 27 2022 14:08:45 EST Ventricular Rate:  62 PR Interval:  148 QRS Duration:  86 QT Interval:  412 QTC Calculation: 418 R Axis:   -34  Text Interpretation: Normal sinus rhythm Left axis deviation When compared with ECG of 30-Jan-2021 20:16, PREVIOUS ECG IS PRESENT Confirmed by Virgina Norfolk [id number]) on 11/27/2022 2:54:59 PM  Radiology CT ANGIO HEAD NECK W WO CM  Result Date: 11/27/2022 CLINICAL DATA:  Neuro deficit, acute, stroke suspected EXAM: CT ANGIOGRAPHY HEAD AND NECK WITH AND WITHOUT CONTRAST TECHNIQUE: Multidetector CT imaging of the head and neck was performed using the standard protocol during bolus administration of intravenous contrast. Multiplanar CT image reconstructions and MIPs were obtained to evaluate the vascular anatomy. Carotid stenosis measurements (when applicable) are obtained utilizing NASCET criteria, using the distal internal carotid diameter as the denominator. RADIATION DOSE REDUCTION: This exam was performed according to the departmental dose-optimization program which includes automated exposure control, adjustment of the mA and/or kV according to patient size and/or use of  iterative reconstruction technique. CONTRAST:  OMNIPAQUE IOHEXOL 350 MG/ML SOLN COMPARISON:  None Available. FINDINGS: CT HEAD FINDINGS Brain: No hemorrhage. No hydrocephalus. No extra-axial fluid collection. No CT evidence of an acute cortical infarct. No mass effect. No mass lesion. Vascular: No hyperdense vessel or unexpected calcification. Skull: Normal. Negative for fracture or focal lesion. Sinuses/Orbits: No middle ear or mastoid effusion. Paranasal sinuses are clear. Orbits are unremarkable. Other: None Review of the MIP images confirms the above findings CTA NECK FINDINGS Aortic arch: Standard branching. Imaged portion shows no evidence of aneurysm or dissection. No significant stenosis of the major arch vessel origins. Right carotid system: No evidence of  dissection, stenosis (50% or greater), or occlusion. Left carotid system: No evidence of dissection, stenosis (50% or greater), or occlusion. Vertebral arteries: Left dominant. No evidence of dissection, stenosis (50% or greater), or occlusion. Skeleton: Negative for acute fracture. There is large dental carie in large dental caries in the left mandibular numerous Other neck: Negative. Upper chest: Partially imaged calcified mediastinal lymphadenopathy Review of the MIP images confirms the above findings CTA HEAD FINDINGS Anterior circulation: No significant stenosis, proximal occlusion, aneurysm, or vascular malformation. Posterior circulation: There is mild narrowing in the distal basilar artery. No proximal occlusion, aneurysm, or vascular Venous sinuses: As permitted by contrast timing, patent. Anatomic variants: Fetal PCA on the right Review of the MIP images confirms the above findings IMPRESSION: 1. No hemorrhage or CT evidence of an acute cortical infarct. 2. No intracranial large vessel occlusion. Mild narrowing in the distal basilar artery. 3. No hemodynamically significant stenosis in the neck. 4. Large dental caries in the left  mandibular numerous. Recommend dental evaluation. 5. See separate CT chest abdomen and pelvis for additional findings Electronically Signed   By: Lorenza Cambridge M.D.   On: 11/27/2022 18:10   CT Angio Chest/Abd/Pel for Dissection W and/or Wo Contrast  Result Date: 11/27/2022 CLINICAL DATA:  Chest pain, left arm numbness, left neck pain and shortness of breath, sudden onset this morning * Tracking Code: BO * EXAM: CT ANGIOGRAPHY CHEST, ABDOMEN AND PELVIS TECHNIQUE: Non-contrast CT of the chest was initially obtained. Multidetector CT imaging through the chest, abdomen and pelvis was performed using the standard protocol during bolus administration of intravenous contrast. Multiplanar reconstructed images and MIPs were obtained and reviewed to evaluate the vascular anatomy. RADIATION DOSE REDUCTION: This exam was performed according to the departmental dose-optimization program which includes automated exposure control, adjustment of the mA and/or kV according to patient size and/or use of iterative reconstruction technique. CONTRAST:  OMNIPAQUE IOHEXOL 350 MG/ML SOLN COMPARISON:  None Available. FINDINGS: CTA CHEST FINDINGS VASCULAR Aorta: Satisfactory opacification of the aorta. Normal contour and caliber of the thoracic aorta. No evidence of aneurysm, dissection, or other acute aortic pathology. Cardiovascular: No evidence of pulmonary embolism on limited non-tailored examination. Normal heart size. Three-vessel coronary artery calcifications. No pericardial effusion. Review of the MIP images confirms the above findings. NON VASCULAR Mediastinum/Nodes: No pathologically enlarged mediastinal, hilar, or axillary lymph nodes. Benign calcified granulomatous pretracheal lymph nodes, for which no further follow-up or characterization is required. Small hiatal hernia thyroid gland, trachea, and esophagus demonstrate no significant findings. Lungs/Pleura: Spiculated nodule of the right lung base measuring 1.4 x  1.3 cm (series 7, image 128). No pleural effusion or pneumothorax. Musculoskeletal: No chest wall abnormality. No acute osseous findings. Review of the MIP images confirms the above findings. CTA ABDOMEN AND PELVIS FINDINGS VASCULAR Normal contour and caliber of the abdominal aorta. No evidence of aneurysm, dissection, or other acute aortic pathology. 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 Scattered aortic atherosclerosis Review of the MIP images confirms the above findings. NON-VASCULAR Hepatobiliary: No focal liver abnormality is seen. Status post cholecystectomy. No biliary dilatation. Pancreas: Unremarkable. No pancreatic ductal dilatation or surrounding inflammatory changes. Spleen: Normal in size. Benign granulomatous calcifications throughout the spleen, for which no further follow-up or characterization is required. Adrenals/Urinary Tract: Adrenal glands are unremarkable. Simple, benign renal cortical cysts, for which no further follow-up or characterization is required. Kidneys are otherwise normal, without renal calculi, solid lesion, or  hydronephrosis. Bladder is unremarkable. Stomach/Bowel: Stomach is within normal limits. Appendix not clearly visualized. No evidence of bowel wall thickening, distention, or inflammatory changes. Pancolonic diverticulosis. Lymphatic: No enlarged abdominal or pelvic lymph nodes. Reproductive: TURP defect of the prostate. Other: No abdominal wall hernia or abnormality. No ascites. Musculoskeletal: No acute osseous findings. IMPRESSION: 1. Normal contour and caliber of the thoracic and abdominal aorta. No evidence of aneurysm, dissection, or other acute aortic pathology. 2. Coronary artery disease. 3. Incidental note of a spiculated nodule of the right lung base measuring 1.4 x 1.3 cm, nonspecific although modestly suspicious for lung malignancy. Consider one of the following in 3  months for both low-risk and high-risk individuals: (a) repeat chest CT, (b) follow-up PET-CT, or (c) tissue sampling. This recommendation follows the consensus statement: Guidelines for Management of Incidental Pulmonary Nodules Detected on CT Images: From the Fleischner Society 2017; Radiology 2017; 284:228-243. 4. Pancolonic diverticulosis without evidence of acute diverticulitis. Aortic Atherosclerosis (ICD10-I70.0). Electronically Signed   By: Jearld Lesch M.D.   On: 11/27/2022 17:37   DG Chest 2 View  Result Date: 11/27/2022 CLINICAL DATA:  Chest pain EXAM: CHEST - 2 VIEW COMPARISON:  01/30/2021 FINDINGS: The lungs are clear without focal pneumonia, edema, pneumothorax or pleural effusion. The cardiopericardial silhouette is within normal limits for size. No acute bony abnormality. Calcified nodal tissue is identified in the right mediastinum, similar to prior and likely granulomatous. IMPRESSION: No active cardiopulmonary disease. Electronically Signed   By: Kennith Center M.D.   On: 11/27/2022 15:09    Procedures Procedures    Medications Ordered in ED Medications  sodium chloride 0.9 % bolus 1,000 mL (0 mLs Intravenous Stopped 11/27/22 1606)  sodium chloride 0.9 % bolus 1,000 mL (0 mLs Intravenous Stopped 11/27/22 1706)  iohexol (OMNIPAQUE) 350 MG/ML injection 150 mL (150 mLs Intravenous Contrast Given 11/27/22 1647)    ED Course/ Medical Decision Making/ A&P                                 Medical Decision Making Amount and/or Complexity of Data Reviewed Labs: ordered. Radiology: ordered.  Risk Prescription drug management.   Donette Larry is here with dizziness chest pain abdominal pain neck pain weakness lightheadedness.  History of hypertension high cholesterol.  Broad differential.  She is got no fever.  Seems less likely to be infectious process seems less likely to be stroke but could be dehydration, dissection, ACS, PE, abdominal process.  Will get CT dissection  study, D-dimer, troponin, CBC CMP lipase urinalysis chest x-ray COVID and flu test CTA head and neck.  Per my review interpretation labs D-dimer is normal.  Will pursue CT dissection study to further evaluate for his chest and abdominal pain and lightheadedness.  His blood pressure was 80 systolic but he had no fever.  His hemoglobin is normal.  His blood pressures improved with IV fluids now up to 117/68.  Have no concern for sepsis.  He has no significant anemia or electrolyte abnormality but his creatinine is elevated to 1.5 from his baseline of 0.8.  Will give an additional fluid bolus.  Neurologically he seems to be intact.  His orthostatics are unremarkable.  Not sure if this is just some dehydration and transient hypotension that caused the symptoms but will do thorough workup with CT imaging of the head neck chest abdomen pelvis to further evaluate.  Will give an additional fluid bolus and may  need to admit him for further hydration.  First troponin is normal as well.  Seems less likely to be ACS.  EKG shows sinus rhythm per my review and interpretation.  Chest x-ray per my review interpretation showed no evidence of pneumonia or pneumothorax.  CT of the head, neck chest abdomen pelvis is unremarkable.  Blood pressure has greatly improved following IV fluids.  Blood pressure now 140 systolic.  Troponin is normal.  Urinalysis negative for infection.  COVID and flu test negative.  Creatinine mildly elevated to 1.5.  Overall I have no concern for stroke he is asymptomatic now.  I suspect a little bit of dehydration causing him to have low blood pressure and having some symptoms from that.  I did offer admission to further hydrate recheck kidney function having maybe work with physical therapy but he prefers to follow-up with his primary care doctor outpatient.  He actually has an appointment in 2 days.  I think this is reasonable.  I do not think there is an emergent process at this time but I do think he  should have his kidney function rechecked.  Understands return precautions.  Discharged in good condition.  This chart was dictated using voice recognition software.  Despite best efforts to proofread,  errors can occur which can change the documentation meaning.         Final Clinical Impression(s) / ED Diagnoses Final diagnoses:  AKI (acute kidney injury) (HCC)  Lightheadedness    Rx / DC Orders ED Discharge Orders     None         Virgina Norfolk, DO 11/27/22 1850

## 2022-11-27 NOTE — ED Notes (Signed)
Pt aware of the need for a urine... Unable to currently provide the sample... 

## 2022-11-27 NOTE — ED Triage Notes (Signed)
In for eval of chest pain, left arm numbness, left neck pain, and SOB. Sudden onset while at work at approx 0900. Denies nausea or vomiting. Characterized pain as squeezing pain. Reports some speech difficulty when episode started but resolved quickly. Reports similar episode with hypotension in the past.

## 2023-07-10 ENCOUNTER — Encounter: Payer: Self-pay | Admitting: Neurology

## 2023-07-10 ENCOUNTER — Ambulatory Visit: Admitting: Neurology

## 2023-07-10 VITALS — BP 95/62 | HR 60 | Ht 74.0 in | Wt 226.0 lb

## 2023-07-10 DIAGNOSIS — R413 Other amnesia: Secondary | ICD-10-CM

## 2023-07-10 DIAGNOSIS — E538 Deficiency of other specified B group vitamins: Secondary | ICD-10-CM | POA: Diagnosis not present

## 2023-07-10 DIAGNOSIS — R29818 Other symptoms and signs involving the nervous system: Secondary | ICD-10-CM

## 2023-07-10 DIAGNOSIS — R251 Tremor, unspecified: Secondary | ICD-10-CM

## 2023-07-10 DIAGNOSIS — E519 Thiamine deficiency, unspecified: Secondary | ICD-10-CM | POA: Diagnosis not present

## 2023-07-10 DIAGNOSIS — R269 Unspecified abnormalities of gait and mobility: Secondary | ICD-10-CM

## 2023-07-10 NOTE — Patient Instructions (Addendum)
 Sertraline: 7 days prior decrease to 25mg  and then 4 days prior to scan stop it and restart right afterwards recommend hearing aids and audiology as hearing is associated with memory loss.  Jenna Renfroe for formal neurocognitive testing  ATN/APOE4 tsting for alzheimer's disease biomarkers in the blood DAT Scan for parkinson's disease vs essential tremor  Brain DaTscan How to prepare and what to expect What is a brain DaTscan? A brain DaTscan is a nuclear medicine scan. It uses radioactive material to diagnose some diseases of the brain, especially those that cause tremor (shakiness). DaTscan is a brand name for a drug called ioflupane I-123. A brain DaTscan is a form of radiology, because radiation is used to take pictures of the body. This radioactive drug is ordered especially for you. Because of this, we need at least 72 hours' notice if you must cancel or reschedule your scan.   How does the scan work? You will be given a small dose of tracer (radioactive material) through an intravenous (IV) line. This tracer will collect in part of your brain and give off gamma rays. A special camera called a gamma camera will use these rays to produce pictures and measurements of your brain. How do I prepare? Some drugs will affect the results of your brain DaTscan. You will need to stop taking these drugs before your scan. The table on page 2 lists the drugs that need to be stopped, and for how many days before your scan. This list is in alphabetical order by the generic name of the drug. The common brand names are listed beneath the generic name. Please confirm these instructions with your doctor who prescribed the drug. Drugs to Stop Taking Before your scan, stop taking these medicines for the length of time shown: Name of Drug Stop Taking  Amoxapine 4 days before  Benztropine  Cogentin 3 days before  Bupropion  (Aplenzin , Budeprion, Voxra, Wellbutrin , Zyban ) 48 hours before  Buspirone 15 hours  before  Citalopram 24 hours before  Cocaine 6 hours before  Escitalopram 24 hours before  Methamphetamine 24 hours before  Methylphenidate (Concerta, Metadate, Methylin, Ritalin) 20 hours before  Paroxetine 24 hours before  Selegilene 48 hours before  Sertraline 3 days before  If you are breastfeeding, or if there is any chance you are pregnant, please tell the scheduler or technologist (the person who will help you prepare for your scan). How is the scan done? When you first arrive, we will ask you to drink a small cup of water with potassium iodine in it. This water may have a metallic taste.  An hour after you drink the potassium iodine water, the technologist will inject a small amount of tracer into a vein in your arm or hand through your IV.  You must stay in the department for 30 minutes after the injection.  You will then have a break for 3 hours. It is OK to eat and drink during this break.  You must return to the clinic after this 3-hour break to have images of your brain taken.  Then, 4 hours after you receive your tracer injection, the technologist will take images of your brain with the gamma camera. You will lie flat on the exam table while these images are being taken.  You must not move while the camera is taking pictures. If you move, the pictures will be blurry and may have to be taken again.  Taking the images will take 40 to 45 minutes. Your total time  in the imaging room will be about 1 hour.  You may also have a low-dose CT scan of your brain to help confirm any results. A CT scan is another way to take images inside your body.  It will take about 5 hours from the time you drink the potassium iodine water until the scans are complete. What will I feel during the scan? The technologist will help make you as comfortable as possible on the exam table for the scan.  You may feel some minor discomfort from the IV.  Lying still on the exam table may be hard for some patients.   The camera will be close to your head. This may make you feel confined or uneasy (claustrophobic). Please tell the doctor who referred you for this scan if you know you are claustrophobic. Are there any side effects from the scan? Most of the radioactivity from the tracer will pass out of your body in your urine or stool. The rest simply goes away over time.  Bad reactions to this scan are very rare. Fewer than 1% of patients (fewer than 1 out of 100) have a bad reaction. Reactions may include headache, nausea, vertigo (dizziness), or dry mouth. How do I get the results? When the test is over, the nuclear medicine doctor will review your images, prepare a written report, and talk with your doctor about the results. Your doctor will then talk with you about the results and your treatment options. If you needed to stop taking any medicines on the day of your scan, ask your doctor when to start taking them again.  The potentially interfering drugs consist of: amoxapine, amphetamine , benztropine, bupropion , buspirone, citalopram, cocaine, mazindol, methamphetamine, methylphenidate, norephedrine, phentermine, escitalopram,  phenylpropanolamine, selegiline, paroxetine, and sertraline

## 2023-07-10 NOTE — Progress Notes (Addendum)
 [facility]    Provider:  Dr Ines Requesting Provider: Gladystine Erminio CROME, MD Primary Care Provider:  Gladystine Erminio CROME, MD  CC:  memory and tremor  HPI:  Scott Ibarra is a 73 y.o. male here as requested by Gladystine Erminio CROME, MD for memory loss. has Varicose veins of bilateral lower extremities with other complications; Spasmodic torticollis; ADD (attention deficit disorder) without hyperactivity; Leg edema, right; Neck pain on right side; Cervical dystonia; Neck pain; and BPH with urinary obstruction on their problem list.  This is a patient of seen in the past diagnosed with mild cognitive impairment saw Dr. Shelle for formal neuropsychological evaluation in 2019.  I reviewed history, when I saw him prior to 2020 he was reporting gradual onset and progressive worsening of memory difficulty over the past several years, an MRI of the brain in 2017 showed some mild chronic microvascular ischemic changes probably within normal limits for age, brain volume was normal for age, no acute findings, he underwent neurocognitive evaluation in August 2017 with Dr. Marykay results were largely within normal limits except for some mild executive dysfunction, there is no evidence for Alzheimer's disease and memory function was an area of relative strength, sleep study in 2017 was negative for sleep apnea or other sleep related disorder, there was some question of ADHD and was tried on Adderall which did not help, Wellbutrin  helped with his mood but he ran out of the medication, his family reported irritability and mood personality changes, he had an FDG PET scan in December 2018 which was normal with no indication of decreased uptake in the areas we associate with Alzheimer's disease or frontotemporal dementia.  At the time he was also under some stress with his wife who had significant medical problems since then she has passed away.  He also had chronic neck pain, chronic  headache, chronic leg pain, no reported mental health history.  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  Per Dr. Jarome evaluation, he was diagnosed with mild cognitive impairment possibly vascular etiology but was stable in 2019 compared to 2017, also major depressive episode, mild, adjustment disorder with anxious mood, results of the cognitive testing indicated mostly normal cognitive function, memory was an area of strength, there were no changes between 2017 and 2019, no evidence of interval decline, although his cognitive profile continued to suggest frontal subcortical involvement rather than cortical dysfunction, she also suspected depression anxiety and caregiver stress playing a large role.    He is on Aricept , Namenda and Zoloft per most recent notes March 01, 2023 visit date, review of referral from Dr. 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  Also has a history of headaches.  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 His brother was diagnosed with POTS and he gets dizzy and walks into walls and uses a cane, blood pressure is on the lower side he is on propranolol and saw cardiology recently and had a workup, feels he was not taken seriously by last neurologist, since 11/2022 after a fall, it was a mechanical fall, carrying a heavy object, he tripped over some boxes, had a bloody nose, hit his head, went straight to ED, the cardiologist was worried about parkinson's disease and has a FHx of tremors but not parkinson's, he shakes more on the roght side, not walkng well, tremor postural and action  but not at rest, his walking is impaired not shuffling but he has an abnormal gait, no decreased smell or taste, he loses his balance but no other falls other than above, he stumbles, denies lightheadedness/dizziness but he does get dizzy when he gets up quickly, no vivid dreams or acting out dreams, no drooling, no constipation, he feels like his voice is softer, handwriting is worse feels like it is smaller his good penmanship is gone.    Reviewed notes, labs and imaging from outside physicians, which showed:   IMPRESSION: reviewed report, images not available to me  1. Mild cerebral volume loss. Subjectively, no significantly disproportionate hippocampal volume loss.   2. Mild leukoaraiosis, including in the pons.   3. No evidence of acute intracranial abnormality.   Electronically Signed by: Italy Holder, MD on 02/11/2022 3:57 PM   MRI brain 2017:  personally reviewed imaging and agree IMPRESSION:  This MRI of the brain without contrast shows the following: 1.    There are some scattered T2/FLAIR hyperintense foci in the subcortical and deep white matter of the hemispheres and in the pons consistent with mild chronic microvascular ischemic change, probably within normal limits for age. 2.    Brain volume is normal for age. 3.    No acute findings.    Review of Systems: Patient complains of symptoms per HPI as well as the following symptoms per hpi. Pertinent negatives and positives per HPI. All others negative.   Social History   Socioeconomic History   Marital status: Widowed    Spouse name: Belinda   Number of children: Not on file   Years of education: 14   Highest education level: Not on file  Occupational History   Occupation: Interior and spatial designer- retired   Tobacco Use   Smoking status: Never   Smokeless tobacco: Never  Vaping Use   Vaping status: Never Used  Substance and Sexual Activity   Alcohol use: Yes    Comment: 1 beer every once in awhile   Drug use: No   Sexual activity: Not on file  Other Topics Concern   Not on file  Social History Narrative   Lives at home with wife.   Caffeine use: 2-3 cups of coffee a day    Right-handed   Social Drivers of Health   Financial Resource Strain: Low Risk  (02/27/2023)   Received from [facility]   Overall Financial Resource Strain (CARDIA)    Difficulty of Paying Living Expenses: Not very hard  Food Insecurity: No Food Insecurity (06/20/2023)  Received from [facility]   Hunger Vital Sign    Within the past 12 months, you worried that your food would run out before you got the money to buy more.: Never true    Within the past 12 months, the food you bought just didn't last and you didn't have money to get more.: Never true  Transportation Needs: No Transportation Needs (06/24/2023)   Received from [facility]   PRAPARE - Transportation    Lack of Transportation (Medical): No    Lack of Transportation (Non-Medical): No  Physical Activity: Sufficiently Active (11/29/2022)   Received from [facility]   Exercise Vital Sign    On average, how many days per week do you engage in moderate to strenuous exercise (like a brisk walk)?: 5 days    On average, how many minutes do you engage in exercise at this level?: 50 min  Stress: No Stress Concern Present  (06/20/2023)   Received from [facility] - Occupational Stress Questionnaire    Feeling of Stress : Not at all  Social Connections: Moderately Integrated (12/02/2021)   Received from [facility]   Social Network    How would you rate your social network (family, work, friends)?: Adequate participation with social networks  Intimate Partner Violence: Not At Risk (06/21/2023)   Received from [facility]   HITS    Over the last 12 months how often did your partner physically hurt you?: Never    Over the last 12 months how often did your partner insult you or talk down to you?: Never    Over the last 12 months how often did your partner threaten you with physical harm?: Never    Over the last 12 months how often did your partner scream or curse at you?: Never    Family History  Problem Relation Age of Onset   Stroke Mother    Cancer Father     Past Medical History:  Diagnosis Date   Benign prostatic hyperplasia    COVID-19 08/24/2020   As if 09/25/20 all symptoms resolved per pt   HLD (hyperlipidemia)    HTN (hypertension)    Major depressive disorder    Meniscus degeneration    L knee; has had repair x 2 and injections   Mild neurocognitive disorder    Pneumonia    Psoriasis    Varicose veins     Patient Active Problem List   Diagnosis Date Noted   BPH with urinary obstruction 10/15/2020   Neck pain 05/10/2017   Neck pain on right side 01/10/2017   Cervical dystonia 01/10/2017   Leg edema, right 07/20/2016   ADD (attention deficit disorder) without hyperactivity 10/28/2015   Spasmodic torticollis 10/12/2014   Varicose veins of bilateral lower extremities with other complications 09/20/2011    Past Surgical History:  Procedure Laterality Date   APPENDECTOMY     CHOLECYSTECTOMY  2011   ligation and stripping left  1994   MENISCUS REPAIR Left 2015   TRANSURETHRAL RESECTION OF PROSTATE N/A 10/15/2020   Procedure:  TRANSURETHRAL RESECTION OF THE PROSTATE (TURP);  Surgeon: Matilda Senior, MD;  Location: [facility];  Service: Urology;  Laterality: N/A;  90 MINS   VARICOSE VEIN SURGERY  2013    Current Outpatient Medications  Medication Sig Dispense Refill   donepezil  (ARICEPT ) 10 MG tablet Take 10 mg by mouth at bedtime.     memantine (NAMENDA) 5 MG tablet Take 5 mg by  mouth 2 (two) times daily.     Multiple Vitamin (MULTIVITAMIN WITH MINERALS) TABS tablet Take 1 tablet by mouth in the morning.     omeprazole (PRILOSEC) 20 MG capsule Take 20 mg by mouth daily.     propranolol ER (INDERAL LA) 60 MG 24 hr capsule Take 60 mg by mouth daily.     sertraline (ZOLOFT) 50 MG tablet Take 50 mg by mouth daily.     tiZANidine  (ZANAFLEX ) 4 MG tablet Take 1 tablet (4 mg total) by mouth every 6 (six) hours as needed for muscle spasms. (Patient taking differently: Take 4 mg by mouth every 8 (eight) hours as needed (cramps).) 90 tablet 3   triamcinolone cream (KENALOG) 0.1 % Apply 1 application topically daily as needed (skin irritation/plaque psoriasis).     No current facility-administered medications for this visit.    Allergies as of 07/10/2023   (No Known Allergies)    Vitals: BP 95/62 (Cuff Size: Normal)   Pulse 60   Ht 6' 2 (1.88 m)   Wt 226 lb (102.5 kg)   BMI 29.02 kg/m  Last Weight:  Wt Readings from Last 1 Encounters:  07/10/23 226 lb (102.5 kg)   Last Height:   Ht Readings from Last 1 Encounters:  07/10/23 6' 2 (1.88 m)     Physical exam: Exam: Gen: NAD, conversant, well nourised, obese, well groomed                     CV: RRR, no MRG. No Carotid Bruits. No peripheral edema, warm, nontender Eyes: Conjunctivae clear without exudates or hemorrhage  Neuro: Detailed Neurologic Exam  Speech:    Speech is normal; fluent and spontaneous with normal comprehension.  Cognition:    The patient is oriented to person, place, and time;     recent and remote memory intact;     language  fluent;     normal attention, concentration,     fund of knowledge Cranial Nerves:    The pupils are equal, round, and reactive to light. Pupils too small to visualize fundi attempted.  Visual fields are full to finger confrontation. Extraocular movements are intact. Trigeminal sensation is intact and the muscles of mastication are normal. The face is symmetric. The palate elevates in the midline. Hearing impaired. Voice is normal. Shoulder shrug is normal. The tongue has normal motion without fasciculations.   Coordination:    Normal finger to nose and heel to shin. Normal rapid alternating movements.   Gait: Imbalance on walking, enbloc turning, decreased right arm swing  Motor Observation:    No asymmetry, no atrophy, and no involuntary movements noted. Tone:    Normal muscle tone.  No cogwheeling.   Posture:    Posture is slightly stooped.     Strength:    Strength is V/V in the upper and lower limbs.      Sensation: intact to LT     Reflex Exam:  DTR's:    Reduced AJs. Deep tendon reflexes in the upper and lower extremities are normal bilaterally.   Toes:    The toes are downgoing bilaterally.   Clonus:    Clonus is absent.  No frontal release signs    Assessment/Plan:  Patient with mild cognitive impairment, prior neurocognitive testing in 2017 and 2019: Per Dr. Jarome neuropsychological evaluation, he was diagnosed with mild cognitive impairment possibly vascular etiology but was stable in 2019 compared to 2017 testing, also major depressive episode, mild, adjustment disorder with  anxious mood, results of the cognitive testing indicated mostly normal cognitive function, memory was an area of strength, there were no changes between 2017 and 2019, no evidence of interval decline, although his cognitive profile continued to suggest frontal subcortical involvement rather than cortical dysfunction, testing was also suggestive of depression anxiety and caregiver stress  playing a large role.  Wellbutrin  in the past helped, he was referred to [facility] behavioral health in 2019, recommended good behavioral strategies to help him compensate for cognitive symptoms and executive dysfunction.  He has hearing problems, recommend hearing aids and audiology as hearing is associated with memory loss.  MMSE 28/30  Maude Rattler for formal neurocognitive testing (please send neurocognitive testing reports from 2017 and 2019 with referral) ATN/APOE4 tsting for alzheimer's disease biomarkers in the blood DAT Scan for parkinson;s disease vs essential tremor Declines PT  Orders Placed This Encounter  Procedures   NM BRAIN DATSCAN TUMOR LOC INFLAM SPECT 1 DAY   ATN PROFILE   APOE Alzheimer's Risk   BUN   B12 and Folate Panel   Vitamin B1   Creatinine, Serum   TSH Rfx on Abnormal to Free T4   Methylmalonic acid, serum   Ambulatory referral to Neuropsychology   No orders of the defined types were placed in this encounter.   Cc: Gladystine Erminio CROME, MD,  Gladystine Erminio CROME, MD  Onetha Epp, MD  [facility] [address] [place], [place] [address]  Phone [phone] Fax [phone]

## 2023-07-11 ENCOUNTER — Telehealth: Payer: Self-pay | Admitting: Neurology

## 2023-07-11 DIAGNOSIS — R413 Other amnesia: Secondary | ICD-10-CM

## 2023-07-11 LAB — TSH RFX ON ABNORMAL TO FREE T4: TSH: 3.58 u[IU]/mL (ref 0.450–4.500)

## 2023-07-11 NOTE — Telephone Encounter (Signed)
 Referral for neuropsychology fax to [facility]. Phone: [phone], Fax; [phone].

## 2023-07-13 LAB — ATN PROFILE
A -- Beta-amyloid 42/40 Ratio: 0.104 (ref >0.102)
Beta-amyloid 40: 209.14 pg/mL
Beta-amyloid 42: 21.75 pg/mL
N -- NfL, Plasma: 9.71 pg/mL — AB (ref 0.00–6.04)
T -- p-tau181: 1.92 pg/mL — AB (ref 0.00–0.97)

## 2023-07-17 ENCOUNTER — Ambulatory Visit: Payer: Self-pay | Admitting: Neurology

## 2023-07-18 LAB — CREATININE, SERUM
Creatinine, Ser: 1.08 mg/dL (ref 0.76–1.27)
eGFR: 72 mL/min/1.73 (ref >59)

## 2023-07-18 LAB — APOE ALZHEIMER'S RISK

## 2023-07-18 LAB — METHYLMALONIC ACID, SERUM: Methylmalonic Acid: 188 nmol/L (ref 0–378)

## 2023-07-18 LAB — B12 AND FOLATE PANEL
Folate: 12.3 ng/mL (ref >3.0)
Vitamin B-12: 407 pg/mL (ref 232–1245)

## 2023-07-18 LAB — BUN: BUN: 19 mg/dL (ref 8–27)

## 2023-07-18 LAB — VITAMIN B1: Thiamine: 139.2 nmol/L (ref 66.5–200.0)

## 2023-07-19 NOTE — Telephone Encounter (Signed)
 Referral for neuropsychology fax to [facility]. Phone: [phone], Fax: [phone]

## 2023-07-19 NOTE — Addendum Note (Signed)
 Addended by: HILLIARD HEATHER CROME on: 07/19/2023 10:24 AM   Modules accepted: Orders

## 2023-07-19 NOTE — Telephone Encounter (Signed)
 Received fax message from [facility]: Unfortunately, we were unable to schedule your patient. Patient refused appointment for the following reason: We are out-of-network with his insurance and he does not have out of network benefits.  Dr. Ines- where would you like to send the referral?

## 2023-07-19 NOTE — Telephone Encounter (Signed)
 New referral placed to Dr Jackquline.

## 2023-07-20 NOTE — Telephone Encounter (Signed)
 Spoke to patient gave labwork results pt thanked me for calling

## 2023-07-20 NOTE — Telephone Encounter (Signed)
-----   Message from True Mar sent at 07/18/2023  4:05 PM EDT ----- Blood chemical tests and genetic testing for Alzheimer's risk does not support underlying Alzheimer's dementia or increased risk to develop Alzheimer's dementia.  Please advise patient. ----- Message ----- From: Hilliard Heather CROME, RN Sent: 07/18/2023   3:41 PM EDT To: True Mar, MD  Can you result for Dr Ines?

## 2023-07-26 ENCOUNTER — Encounter (HOSPITAL_COMMUNITY)
Admission: RE | Admit: 2023-07-26 | Discharge: 2023-07-26 | Disposition: A | Discharge status: Home / Self Care | Source: Ambulatory Visit | Attending: Neurology | Admitting: Neurology

## 2023-07-26 ENCOUNTER — Encounter (HOSPITAL_COMMUNITY): Payer: Self-pay | Admitting: Internal Medicine

## 2023-07-26 ENCOUNTER — Encounter (HOSPITAL_COMMUNITY): Payer: Self-pay

## 2023-07-26 ENCOUNTER — Ambulatory Visit (HOSPITAL_COMMUNITY)
Admission: RE | Admit: 2023-07-26 | Discharge: 2023-07-26 | Disposition: A | Discharge status: Home / Self Care | Source: Ambulatory Visit | Attending: Neurology | Admitting: Neurology

## 2023-07-26 DIAGNOSIS — R29818 Other symptoms and signs involving the nervous system: Secondary | ICD-10-CM

## 2023-07-26 DIAGNOSIS — R269 Unspecified abnormalities of gait and mobility: Secondary | ICD-10-CM | POA: Diagnosis present

## 2023-07-26 DIAGNOSIS — R251 Tremor, unspecified: Secondary | ICD-10-CM | POA: Diagnosis present

## 2023-07-26 DIAGNOSIS — G20C Parkinsonism, unspecified: Secondary | ICD-10-CM

## 2023-07-26 MED ORDER — POTASSIUM IODIDE (ANTIDOTE) 130 MG PO TABS
ORAL_TABLET | ORAL | Status: AC
Start: 2023-07-26 — End: 2023-07-26
  Filled 2023-07-26: qty 1

## 2023-07-26 MED ORDER — IOFLUPANE I 123 185 MBQ/2.5ML IV SOLN
4.5000 | Freq: Once | INTRAVENOUS | Status: AC
  Administered 2023-07-26: 4.5 via INTRAVENOUS
  Filled 2023-07-26: qty 5

## 2023-08-17 ENCOUNTER — Ambulatory Visit (INDEPENDENT_AMBULATORY_CARE_PROVIDER_SITE_OTHER): Admitting: Orthopaedic Surgery

## 2023-08-17 ENCOUNTER — Ambulatory Visit (HOSPITAL_BASED_OUTPATIENT_CLINIC_OR_DEPARTMENT_OTHER): Payer: Self-pay | Admitting: Orthopaedic Surgery

## 2023-08-17 ENCOUNTER — Ambulatory Visit (HOSPITAL_BASED_OUTPATIENT_CLINIC_OR_DEPARTMENT_OTHER)

## 2023-08-17 ENCOUNTER — Other Ambulatory Visit (HOSPITAL_BASED_OUTPATIENT_CLINIC_OR_DEPARTMENT_OTHER): Payer: Self-pay

## 2023-08-17 DIAGNOSIS — M25511 Pain in right shoulder: Secondary | ICD-10-CM | POA: Diagnosis not present

## 2023-08-17 DIAGNOSIS — M25512 Pain in left shoulder: Secondary | ICD-10-CM | POA: Diagnosis not present

## 2023-08-17 DIAGNOSIS — G8929 Other chronic pain: Secondary | ICD-10-CM | POA: Diagnosis not present

## 2023-08-17 MED ORDER — OXYCODONE HCL 5 MG PO TABS: 5.0000 mg | ORAL_TABLET | ORAL | 0 refills | Status: DC | PRN

## 2023-08-17 MED ORDER — ASPIRIN 325 MG PO TBEC: 325.0000 mg | DELAYED_RELEASE_TABLET | Freq: Every day | ORAL | 0 refills | Status: DC

## 2023-08-17 MED ORDER — ACETAMINOPHEN 500 MG PO TABS: 500.0000 mg | ORAL_TABLET | Freq: Three times a day (TID) | ORAL | 0 refills | Status: AC

## 2023-08-17 MED ORDER — IBUPROFEN 800 MG PO TABS: 800.0000 mg | ORAL_TABLET | Freq: Three times a day (TID) | ORAL | 0 refills | Status: AC

## 2023-08-17 NOTE — Progress Notes (Signed)
 Chief Complaint: Bilateral shoulder pain     History of Present Illness:    Scott Ibarra is a 73 y.o. male presents today with right worse than left shoulder pain.  He has experienced extremely difficult overhead range of motion for the last several years.  He does endorse a history of injections.  He has trialed anti-inflammatory medications.  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  Unfortunately at this time this is restricting him from doing basic activities of daily living and attempting to stay active.  The right is more symptomatic with more crepitus in the left    PMH/PSH/Family History/Social History/Meds/Allergies:    Past Medical History:  Diagnosis Date   Benign prostatic hyperplasia    COVID-19 08/24/2020   As if 09/25/20 all symptoms resolved per pt   HLD (hyperlipidemia)    HTN (hypertension)    Major depressive disorder    Meniscus degeneration    L knee; has had repair x 2 and injections   Mild neurocognitive disorder    Pneumonia    Psoriasis    Varicose veins    Past Surgical History:  Procedure Laterality Date   APPENDECTOMY     CHOLECYSTECTOMY  2011   ligation and stripping left  1994   MENISCUS REPAIR Left 2015   TRANSURETHRAL RESECTION OF PROSTATE N/A 10/15/2020   Procedure: TRANSURETHRAL RESECTION OF THE PROSTATE (TURP);  Surgeon: Matilda Senior, MD;  Location: [facility];  Service: Urology;  Laterality: N/A;  90 MINS   VARICOSE VEIN SURGERY  2013   Social History   Socioeconomic History   Marital status: Widowed    Spouse name: Belinda   Number of children: Not on file   Years of education: 14   Highest education level: Not on file  Occupational History   Occupation: Interior and spatial designer- retired   Tobacco Use   Smoking status: Never   Smokeless tobacco: Never  Vaping Use   Vaping status: Never Used  Substance and Sexual Activity   Alcohol use: Yes    Comment: 1 beer every once in awhile    Drug use: No   Sexual activity: Not on file  Other Topics Concern   Not on file  Social History Narrative   Lives at home with wife.   Caffeine use: 2-3 cups of coffee a day    Right-handed   Social Drivers of Health   Financial Resource Strain: Low Risk  (02/27/2023)   Received from [facility]   Overall Financial Resource Strain (CARDIA)    Difficulty of Paying Living Expenses: Not very hard  Food Insecurity: No Food Insecurity (06/20/2023)   Received from [facility]   Hunger Vital Sign    Within the past 12 months, you worried that your food would run out before you got the money to buy more.: Never true    Within the past 12 months, the food you bought just didn't last and you didn't have money to get more.: Never true  Transportation Needs: No Transportation Needs (06/24/2023)   Received from [facility] - Transportation    Lack of Transportation (Medical): No    Lack of Transportation (Non-Medical): No  Physical Activity: Sufficiently Active (11/29/2022)   Received from [facility]   Exercise Vital Sign    On average, how many days per week do you engage in moderate to strenuous exercise (like a brisk walk)?: 5 days    On average,  how many minutes do you engage in exercise at this level?: 50 min  Stress: No Stress Concern Present (06/20/2023)   Received from [facility] - Occupational Stress Questionnaire    Feeling of Stress : Not at all  Social Connections: Moderately Integrated (12/02/2021)   Received from [facility]   Social Network    How would you rate your social network (family, work, friends)?: Adequate participation with social networks   Family History  Problem Relation Age of Onset   Stroke Mother    Cancer Father    No Known Allergies Current Outpatient Medications  Medication Sig Dispense Refill   donepezil (ARICEPT) 10 MG tablet Take 10 mg by mouth at bedtime.     memantine (NAMENDA) 5  MG tablet Take 5 mg by mouth 2 (two) times daily.     Multiple Vitamin (MULTIVITAMIN WITH MINERALS) TABS tablet Take 1 tablet by mouth in the morning.     omeprazole (PRILOSEC) 20 MG capsule Take 20 mg by mouth daily.     propranolol ER (INDERAL LA) 60 MG 24 hr capsule Take 60 mg by mouth daily.     sertraline (ZOLOFT) 50 MG tablet Take 50 mg by mouth daily.     tiZANidine (ZANAFLEX) 4 MG tablet Take 1 tablet (4 mg total) by mouth every 6 (six) hours as needed for muscle spasms. (Patient taking differently: Take 4 mg by mouth every 8 (eight) hours as needed (cramps).) 90 tablet 3   triamcinolone cream (KENALOG) 0.1 % Apply 1 application topically daily as needed (skin irritation/plaque psoriasis).     No current facility-administered medications for this visit.   No results found.  Review of Systems:   A ROS was performed including pertinent positives and negatives as documented in the HPI.  Physical Exam :   Constitutional: NAD and appears stated age Neurological: Alert and oriented Psych: Appropriate affect and cooperative There were no vitals taken for this visit.   Comprehensive Musculoskeletal Exam:    Right shoulder active forward elevation is to 110 and left shoulder is to 120 in forward elevation.  External rotation at side is to 15 degrees bilaterally.  Internal rotation is to back pocket.  There is positive crepitus with forward elevation on the right and pain this neurosensory exam is intact   Imaging:   Xray (3 views right shoulder, 3 views left shoulder): Advanced right worse than left osteoarthritis of the glenohumeral joint \   I personally reviewed and interpreted the radiographs.   Assessment and Plan:   73 y.o. 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  At this time he has trialed conservative management without any relief including anti-inflammatories home strengthening and he is now unable to do activities of daily lifting.  I do  ultimately believe he would be a candidate for right shoulder reverse shoulder arthroplasty.  In the meantime we will also plan for a left shoulder glenohumeral injection to hopefully get him some relief while we are considering treatment of the right.  -Plan for right shoulder reverse shoulder arthroplasty   After a lengthy discussion of treatment options, including risks, benefits, alternatives, complications of surgical and nonsurgical conservative options, the patient elected surgical repair.   The patient  is aware of the material risks  and complications including, but not limited to injury to adjacent structures, neurovascular injury, infection, numbness, bleeding, implant failure, thermal burns, stiffness, persistent pain, failure to heal, disease transmission from allograft,  need for further surgery, dislocation, anesthetic risks, blood clots, risks of death,and others. The probabilities of surgical success and failure discussed with patient given their particular co-morbidities.The time and nature of expected rehabilitation and recovery was discussed.The patient's questions were all answered preoperatively.  No barriers to understanding were noted. I explained the natural history of the disease process and Rx rationale.  I explained to the patient what I considered to be reasonable expectations given their personal situation.  The final treatment plan was arrived at through a shared patient decision making process model.    I personally saw and evaluated the patient, and participated in the management and treatment plan.  Elspeth Parker, MD Attending Physician, Orthopedic Surgery  This document was dictated using Dragon voice recognition software. A reasonable attempt at proof reading has been made to minimize errors.

## 2023-08-18 ENCOUNTER — Encounter (HOSPITAL_BASED_OUTPATIENT_CLINIC_OR_DEPARTMENT_OTHER): Payer: Self-pay | Admitting: Orthopaedic Surgery

## 2023-08-21 ENCOUNTER — Ambulatory Visit
Admission: RE | Admit: 2023-08-21 | Discharge: 2023-08-21 | Disposition: A | Discharge status: Home / Self Care | Source: Ambulatory Visit | Attending: Orthopaedic Surgery | Admitting: Orthopaedic Surgery

## 2023-08-21 DIAGNOSIS — G8929 Other chronic pain: Secondary | ICD-10-CM

## 2023-09-07 ENCOUNTER — Ambulatory Visit (INDEPENDENT_AMBULATORY_CARE_PROVIDER_SITE_OTHER): Admitting: Orthopaedic Surgery

## 2023-09-07 DIAGNOSIS — G8929 Other chronic pain: Secondary | ICD-10-CM | POA: Diagnosis not present

## 2023-09-07 DIAGNOSIS — M25511 Pain in right shoulder: Secondary | ICD-10-CM | POA: Diagnosis not present

## 2023-09-07 NOTE — Progress Notes (Signed)
 Chief Complaint: Bilateral shoulder pain     History of Present Illness:   09/07/2023: Presents today for for discussion of the right shoulder  Scott Ibarra is a 73 y.o. male presents today with right worse than left shoulder pain.  He has experienced extremely difficult overhead range of motion for the last several years.  He does endorse a history of injections.  He has trialed anti-inflammatory medications.  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  Unfortunately at this time this is restricting him from doing basic activities of daily living and attempting to stay active.  The right is more symptomatic with more crepitus in the left    PMH/PSH/Family History/Social History/Meds/Allergies:    Past Medical History:  Diagnosis Date   Benign prostatic hyperplasia    COVID-19 08/24/2020   As if 09/25/20 all symptoms resolved per pt   HLD (hyperlipidemia)    HTN (hypertension)    Major depressive disorder    Meniscus degeneration    L knee; has had repair x 2 and injections   Mild neurocognitive disorder    Pneumonia    Psoriasis    Varicose veins    Past Surgical History:  Procedure Laterality Date   APPENDECTOMY     CHOLECYSTECTOMY  2011   ligation and stripping left  1994   MENISCUS REPAIR Left 2015   TRANSURETHRAL RESECTION OF PROSTATE N/A 10/15/2020   Procedure: TRANSURETHRAL RESECTION OF THE PROSTATE (TURP);  Surgeon: Matilda Senior, MD;  Location: [facility];  Service: Urology;  Laterality: N/A;  90 MINS   VARICOSE VEIN SURGERY  2013   Social History   Socioeconomic History   Marital status: Widowed    Spouse name: Belinda   Number of children: Not on file   Years of education: 14   Highest education level: Not on file  Occupational History   Occupation: Interior and spatial designer- retired   Tobacco Use   Smoking status: Never   Smokeless tobacco: Never  Vaping Use   Vaping status: Never Used  Substance and Sexual Activity    Alcohol use: Yes    Comment: 1 beer every once in awhile   Drug use: No   Sexual activity: Not on file  Other Topics Concern   Not on file  Social History Narrative   Lives at home with wife.   Caffeine use: 2-3 cups of coffee a day    Right-handed   Social Drivers of Health   Financial Resource Strain: Low Risk  (02/27/2023)   Received from [facility]   Overall Financial Resource Strain (CARDIA)    Difficulty of Paying Living Expenses: Not very hard  Food Insecurity: No Food Insecurity (06/20/2023)   Received from [facility]   Hunger Vital Sign    Within the past 12 months, you worried that your food would run out before you got the money to buy more.: Never true    Within the past 12 months, the food you bought just didn't last and you didn't have money to get more.: Never true  Transportation Needs: No Transportation Needs (06/24/2023)   Received from [facility] - Transportation    Lack of Transportation (Medical): No    Lack of Transportation (Non-Medical): No  Physical Activity: Sufficiently Active (11/29/2022)   Received from [facility]   Exercise Vital Sign    On average, how many days per week do you engage in moderate to strenuous exercise (like  a brisk walk)?: 5 days    On average, how many minutes do you engage in exercise at this level?: 50 min  Stress: No Stress Concern Present (06/20/2023)   Received from [facility] - Occupational Stress Questionnaire    Feeling of Stress : Not at all  Social Connections: Moderately Integrated (12/02/2021)   Received from [facility]   Social Network    How would you rate your social network (family, work, friends)?: Adequate participation with social networks   Family History  Problem Relation Age of Onset   Stroke Mother    Cancer Father    No Known Allergies Current Outpatient Medications  Medication Sig Dispense Refill   aspirin  EC 325 MG tablet  Take 1 tablet (325 mg total) by mouth daily. 14 tablet 0   donepezil  (ARICEPT ) 10 MG tablet Take 10 mg by mouth at bedtime.     memantine (NAMENDA) 5 MG tablet Take 5 mg by mouth 2 (two) times daily.     Multiple Vitamin (MULTIVITAMIN WITH MINERALS) TABS tablet Take 1 tablet by mouth in the morning.     omeprazole (PRILOSEC) 20 MG capsule Take 20 mg by mouth daily.     oxyCODONE  (ROXICODONE ) 5 MG immediate release tablet Take 1 tablet (5 mg total) by mouth every 4 (four) hours as needed for severe pain (pain score 7-10) or breakthrough pain. 10 tablet 0   propranolol ER (INDERAL LA) 60 MG 24 hr capsule Take 60 mg by mouth daily.     sertraline (ZOLOFT) 50 MG tablet Take 50 mg by mouth daily.     tiZANidine  (ZANAFLEX ) 4 MG tablet Take 1 tablet (4 mg total) by mouth every 6 (six) hours as needed for muscle spasms. (Patient taking differently: Take 4 mg by mouth every 8 (eight) hours as needed (cramps).) 90 tablet 3   triamcinolone cream (KENALOG) 0.1 % Apply 1 application topically daily as needed (skin irritation/plaque psoriasis).     No current facility-administered medications for this visit.   No results found.  Review of Systems:   A ROS was performed including pertinent positives and negatives as documented in the HPI.  Physical Exam :   Constitutional: NAD and appears stated age Neurological: Alert and oriented Psych: Appropriate affect and cooperative There were no vitals taken for this visit.   Comprehensive Musculoskeletal Exam:    Right shoulder active forward elevation is to 110 and left shoulder is to 120 in forward elevation.  External rotation at side is to 15 degrees bilaterally.  Internal rotation is to back pocket.  There is positive crepitus with forward elevation on the right and pain this neurosensory exam is intact   Imaging:   Xray (3 views right shoulder, 3 views left shoulder): Advanced right worse than left osteoarthritis of the glenohumeral joint \   I  personally reviewed and interpreted the radiographs.   Assessment and Plan:   73 y.o. 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  At this time he has trialed conservative management without any relief including anti-inflammatories home strengthening and he is now unable to do activities of daily lifting.  I do ultimately believe he would be a candidate for right shoulder reverse shoulder arthroplasty.  In the meantime we will also plan for a left shoulder glenohumeral injection to hopefully get him some relief while we are considering treatment of the right.  -Plan for right shoulder reverse shoulder arthroplasty   After  a lengthy discussion of treatment options, including risks, benefits, alternatives, complications of surgical and nonsurgical conservative options, the patient elected surgical repair.   The patient  is aware of the material risks  and complications including, but not limited to injury to adjacent structures, neurovascular injury, infection, numbness, bleeding, implant failure, thermal burns, stiffness, persistent pain, failure to heal, disease transmission from allograft, need for further surgery, dislocation, anesthetic risks, blood clots, risks of death,and others. The probabilities of surgical success and failure discussed with patient given their particular co-morbidities.The time and nature of expected rehabilitation and recovery was discussed.The patient's questions were all answered preoperatively.  No barriers to understanding were noted. I explained the natural history of the disease process and Rx rationale.  I explained to the patient what I considered to be reasonable expectations given their personal situation.  The final treatment plan was arrived at through a shared patient decision making process model.    I personally saw and evaluated the patient, and participated in the management and treatment plan.  Elspeth Parker, MD Attending  Physician, Orthopedic Surgery  This document was dictated using Dragon voice recognition software. A reasonable attempt at proof reading has been made to minimize errors.

## 2023-09-08 ENCOUNTER — Other Ambulatory Visit (HOSPITAL_BASED_OUTPATIENT_CLINIC_OR_DEPARTMENT_OTHER): Payer: Self-pay

## 2023-09-08 MED ORDER — FLUZONE HIGH-DOSE 0.5 ML IM SUSY
0.5000 mL | PREFILLED_SYRINGE | Freq: Once | INTRAMUSCULAR | 0 refills | Status: AC
Start: 2023-09-08 — End: 2023-09-09
  Filled 2023-09-08: qty 0.5, 1d supply, fill #0

## 2023-10-03 ENCOUNTER — Other Ambulatory Visit: Payer: Self-pay

## 2023-10-03 ENCOUNTER — Emergency Department (HOSPITAL_BASED_OUTPATIENT_CLINIC_OR_DEPARTMENT_OTHER): Admitting: Radiology

## 2023-10-03 ENCOUNTER — Emergency Department (HOSPITAL_BASED_OUTPATIENT_CLINIC_OR_DEPARTMENT_OTHER)
Admission: EM | Admit: 2023-10-03 | Discharge: 2023-10-04 | Disposition: A | Discharge status: Home / Self Care | Attending: Emergency Medicine | Admitting: Emergency Medicine

## 2023-10-03 DIAGNOSIS — I10 Essential (primary) hypertension: Secondary | ICD-10-CM | POA: Insufficient documentation

## 2023-10-03 DIAGNOSIS — R059 Cough, unspecified: Secondary | ICD-10-CM | POA: Diagnosis present

## 2023-10-03 DIAGNOSIS — U071 COVID-19: Secondary | ICD-10-CM | POA: Diagnosis not present

## 2023-10-03 DIAGNOSIS — Z7982 Long term (current) use of aspirin: Secondary | ICD-10-CM | POA: Diagnosis not present

## 2023-10-03 LAB — BASIC METABOLIC PANEL WITH GFR
Anion gap: 13 (ref 5–15)
BUN: 12 mg/dL (ref 8–23)
CO2: 23 mmol/L (ref 22–32)
Calcium: 9.5 mg/dL (ref 8.9–10.3)
Chloride: 101 mmol/L (ref 98–111)
Creatinine, Ser: 0.99 mg/dL (ref 0.61–1.24)
GFR, Estimated: >60 mL/min (ref >60)
Glucose, Bld: 119 mg/dL — ABNORMAL HIGH (ref 70–99)
Potassium: 3.9 mmol/L (ref 3.5–5.1)
Sodium: 137 mmol/L (ref 135–145)

## 2023-10-03 LAB — CBC
HCT: 43.3 % (ref 39.0–52.0)
Hemoglobin: 14.4 g/dL (ref 13.0–17.0)
MCH: 30.2 pg (ref 26.0–34.0)
MCHC: 33.3 g/dL (ref 30.0–36.0)
MCV: 90.8 fL (ref 80.0–100.0)
Platelets: 144 K/uL — ABNORMAL LOW (ref 150–400)
RBC: 4.77 MIL/uL (ref 4.22–5.81)
RDW: 13.8 % (ref 11.5–15.5)
WBC: 10.5 K/uL (ref 4.0–10.5)
nRBC: 0 % (ref 0.0–0.2)

## 2023-10-03 LAB — RESP PANEL BY RT-PCR (RSV, FLU A&B, COVID)  RVPGX2
Influenza A by PCR: NEGATIVE
Influenza B by PCR: NEGATIVE
Resp Syncytial Virus by PCR: NEGATIVE
SARS Coronavirus 2 by RT PCR: POSITIVE — AB

## 2023-10-03 MED ORDER — ACETAMINOPHEN 500 MG PO TABS
1000.0000 mg | ORAL_TABLET | Freq: Once | ORAL | Status: AC
Start: 2023-10-03 — End: 2023-10-03
  Administered 2023-10-03: 1000 mg via ORAL
  Filled 2023-10-03: qty 2

## 2023-10-03 NOTE — ED Triage Notes (Signed)
 Pt POV reporting cough, SOB, fatigue, and chills x1 day, family member covid+. Hx lung CA, lobectomy in June.

## 2023-10-04 ENCOUNTER — Other Ambulatory Visit (HOSPITAL_BASED_OUTPATIENT_CLINIC_OR_DEPARTMENT_OTHER): Payer: Self-pay | Admitting: Orthopaedic Surgery

## 2023-10-04 ENCOUNTER — Encounter (HOSPITAL_BASED_OUTPATIENT_CLINIC_OR_DEPARTMENT_OTHER): Payer: Self-pay

## 2023-10-04 DIAGNOSIS — G8929 Other chronic pain: Secondary | ICD-10-CM

## 2023-10-04 LAB — LACTIC ACID, PLASMA: Lactic Acid, Venous: 1.2 mmol/L (ref 0.5–1.9)

## 2023-10-04 MED ORDER — PAXLOVID (300/100) 20 X 150 MG & 10 X 100MG PO TBPK: 3.0000 | ORAL_TABLET | Freq: Two times a day (BID) | ORAL | 0 refills | Status: AC

## 2023-10-04 MED ORDER — IBUPROFEN 400 MG PO TABS
400.0000 mg | ORAL_TABLET | Freq: Once | ORAL | Status: AC
  Administered 2023-10-04: 400 mg via ORAL
  Filled 2023-10-04: qty 1

## 2023-10-04 NOTE — ED Notes (Signed)
 Patient tolerating PO intake. Drinking second cup of ice water with no complaints of nausea.

## 2023-10-04 NOTE — ED Notes (Signed)
 Patient ambulated with SP02 monitoring.  Patient ambulated with a steady gait with his cane.  SP02 maintained 96-97% on room air, heart rate maintained 70-72 bpm while ambulating.  Patient denies any shortness of breath, or dizziness while ambulating.

## 2023-10-04 NOTE — ED Provider Notes (Signed)
 [facility] AT [facility] Provider Note   CSN: [id number] Arrival date & time: 10/03/23  2236     Patient presents with: Shortness of Breath   Scott Ibarra is a 73 y.o. male.   HPI     This is a 73 year old male who presents with shortness of breath, fatigue, cough.  Family member recently tested positive for COVID-19.  Noted temperature at home to 103.  Family noted that he seemed out of it and confused.  History of lung cancer with prior lobectomy.  Patient does not provide much history.  He seems confused and delirious.  Initial temperature 100 orally.  Level 5 caveat  Prior to Admission medications   Medication Sig Start Date End Date Taking? Authorizing Provider  nirmatrelvir/ritonavir (PAXLOVID, 300/100,) 20 x 150 MG & 10 x 100MG  TBPK Take 3 tablets by mouth 2 (two) times daily for 5 days. Patient GFR is >60.  Take nirmatrelvir (150 mg) two tablets twice daily for 5 days and ritonavir (100 mg) one tablet twice daily for 5 days. 10/04/23 10/09/23 Yes Bonni Neuser, Charmaine FALCON, MD  aspirin  EC 325 MG tablet Take 1 tablet (325 mg total) by mouth daily. 08/17/23   Genelle Standing, MD  donepezil  (ARICEPT ) 10 MG tablet Take 10 mg by mouth at bedtime. 03/02/23   [provider]  memantine (NAMENDA) 5 MG tablet Take 5 mg by mouth 2 (two) times daily. 05/02/23   [provider]  Multiple Vitamin (MULTIVITAMIN WITH MINERALS) TABS tablet Take 1 tablet by mouth in the morning.    [provider]  omeprazole (PRILOSEC) 20 MG capsule Take 20 mg by mouth daily. 03/10/22   [provider]  oxyCODONE  (ROXICODONE ) 5 MG immediate release tablet Take 1 tablet (5 mg total) by mouth every 4 (four) hours as needed for severe pain (pain score 7-10) or breakthrough pain. 08/17/23   Genelle Standing, MD  propranolol ER (INDERAL LA) 60 MG 24 hr capsule Take 60 mg by mouth daily. 05/02/23   [provider]  sertraline (ZOLOFT) 50 MG tablet Take  50 mg by mouth daily. 03/02/23   [provider]  tiZANidine  (ZANAFLEX ) 4 MG tablet Take 1 tablet (4 mg total) by mouth every 6 (six) hours as needed for muscle spasms. Patient taking differently: Take 4 mg by mouth every 8 (eight) hours as needed (cramps). 06/15/17   Onita Duos, MD  triamcinolone cream (KENALOG) 0.1 % Apply 1 application topically daily as needed (skin irritation/plaque psoriasis). 12/31/15   [provider]    Allergies: Patient has no known allergies.    Review of Systems  Constitutional:  Positive for fever.  Respiratory:  Positive for cough and shortness of breath.   Psychiatric/Behavioral:  Positive for confusion.   All other systems reviewed and are negative.   Updated Vital Signs BP 113/64   Pulse 64   Temp 98 F (36.7 C)   Resp 19   SpO2 97%   Physical Exam Vitals and nursing note reviewed.  Constitutional:      Appearance: He is well-developed. He is ill-appearing. He is not toxic-appearing.  HENT:     Head: Normocephalic and atraumatic.     Mouth/Throat:     Mouth: Mucous membranes are moist.  Eyes:     Pupils: Pupils are equal, round, and reactive to light.  Cardiovascular:     Rate and Rhythm: Normal rate and regular rhythm.     Heart sounds: Normal heart sounds. No murmur  heard. Pulmonary:     Effort: Pulmonary effort is normal. No respiratory distress.     Breath sounds: Normal breath sounds. No wheezing.  Abdominal:     General: Bowel sounds are normal.     Palpations: Abdomen is soft.     Tenderness: There is no abdominal tenderness. There is no rebound.  Musculoskeletal:     Cervical back: Neck supple.     Right lower leg: No tenderness. No edema.     Left lower leg: No tenderness. No edema.  Lymphadenopathy:     Cervical: No cervical adenopathy.  Skin:    General: Skin is warm and dry.  Neurological:     Mental Status: He is alert.     Comments: Disoriented, only oriented to self  Psychiatric:     Comments:  Picking, appears delirious     (all labs ordered are listed, but only abnormal results are displayed) Labs Reviewed  RESP PANEL BY RT-PCR (RSV, FLU A&B, COVID)  RVPGX2 - Abnormal; Notable for the following components:      Result Value   SARS Coronavirus 2 by RT PCR POSITIVE (*)    All other components within normal limits  BASIC METABOLIC PANEL WITH GFR - Abnormal; Notable for the following components:   Glucose, Bld 119 (*)    All other components within normal limits  CBC - Abnormal; Notable for the following components:   Platelets 144 (*)    All other components within normal limits  LACTIC ACID, PLASMA    EKG: None  Radiology: DG Chest 2 View Result Date: 10/03/2023 CLINICAL DATA:  Shortness of breath. Cough and fatigue. History of lung cancer, lobectomy in June. EXAM: CHEST - 2 VIEW COMPARISON:  Radiograph and CT 11/27/2023 FINDINGS: Patient is significantly rotated. Lung volumes are low. The heart is normal in size for technique. Aortic tortuosity. Volume loss in the right hemithorax is likely postoperative. There are calcified right mediastinal lymph nodes, unchanged from prior. Calcified granuloma in the right lung. No confluent opacity, pulmonary edema, or pneumothorax. IMPRESSION: 1. Low lung volumes. Volume loss in the right hemithorax is likely postoperative. 2. Chronic sequela of prior granulomatous disease. Electronically Signed   By: Andrea Gasman M.D.   On: 10/03/2023 23:29     Procedures   Medications Ordered in the ED  acetaminophen  (TYLENOL ) tablet 1,000 mg (1,000 mg Oral Given 10/03/23 2337)  ibuprofen  (ADVIL ) tablet 400 mg (400 mg Oral Given 10/04/23 0129)    Clinical Course as of 10/04/23 0230  Wed Oct 04, 2023  0047 Workup really only notable for positive COVID-19.  Patient still delirious on repeat evaluation.  O2 sats marginal and 88 to 92% on room air. [CH]  0227 Fever aggressively treated.  On recheck, temperature 98.  Patient states he feels much  better.  No longer delirious.  O2 sats in the room 98%.  He ambulated and maintained pulse ox 96 to 97% without tachypnea or tachycardia.  Would like to trial going home.  We discussed the risk and benefits of Paxlovid.  Suspect his prior marginal may have been poor waveform and/or effort.  Clearly, markedly improved. [CH]    Clinical Course User Index [CH] Joziyah Roblero, Charmaine FALCON, MD                                 Medical Decision Making Amount and/or Complexity of Data Reviewed Labs: ordered. Radiology: ordered.  Risk OTC  drugs. Prescription drug management.   This patient presents to the ED for concern of shortness of breath, fever, cough, this involves an extensive number of treatment options, and is a complaint that carries with it a high risk of complications and morbidity.  I considered the following differential and admission for this acute, potentially life threatening condition.  The differential diagnosis includes viral illness such as COVID or influenza, encephalopathy, pneumonia  MDM:    This is a 73 year old male who presents with shortness of breath, cough, fever.  Noted to be delirious on initial evaluation.  Does not provide much history.  Positive COVID-19 contact.  Labs obtained.  Patient noted to have marginal low 90 O2 sats initially but is in no respiratory distress.  Chest x-ray shows no evidence of pneumonia or pneumothorax.  Labs obtained.  No leukocytosis.  No significant metabolic derangements.  Lactate is normal.  Positive for COVID-19.  Suspect delirium may be related to fever.  He was initially dosed Tylenol  and subsequently given a small dose of ibuprofen .  On recheck, he is significantly improved.  He has better mentation and is now oriented x 3.  Family states he is at his baseline.  He was able to ambulate independently and maintain his pulse ox.  Suspect that his acute delirium may be related to fever.  He has significantly improved.  We discussed the risk and  benefits of Paxlovid.  Will trial as an outpatient.  Discussed with patient and family the importance of hydration and fever control given his delirium with fever.  Family and patient state understanding.  (Labs, imaging, consults)  Labs: I Ordered, and personally interpreted labs.  The pertinent results include: CBC, BMP, COVID, flu, lactate  Imaging Studies ordered: I ordered imaging studies including chest x-ray I independently visualized and interpreted imaging. I agree with the radiologist interpretation  Additional history obtained from daughter at bedside.  External records from outside source obtained and reviewed including prior evaluations  Cardiac Monitoring: The patient was maintained on a cardiac monitor.  If on the cardiac monitor, I personally viewed and interpreted the cardiac monitored which showed an underlying rhythm of: Sinus  Reevaluation: After the interventions noted above, I reevaluated the patient and found that they have :improved  Social Determinants of Health:  lives independently  Disposition: Discharge  Co morbidities that complicate the patient evaluation  Past Medical History:  Diagnosis Date   Benign prostatic hyperplasia    COVID-19 08/24/2020   As if 09/25/20 all symptoms resolved per pt   HLD (hyperlipidemia)    HTN (hypertension)    Major depressive disorder    Meniscus degeneration    L knee; has had repair x 2 and injections   Mild neurocognitive disorder    Pneumonia    Psoriasis    Varicose veins      Medicines Meds ordered this encounter  Medications   acetaminophen  (TYLENOL ) tablet 1,000 mg   ibuprofen  (ADVIL ) tablet 400 mg   nirmatrelvir/ritonavir (PAXLOVID, 300/100,) 20 x 150 MG & 10 x 100MG  TBPK    Sig: Take 3 tablets by mouth 2 (two) times daily for 5 days. Patient GFR is >60.  Take nirmatrelvir (150 mg) two tablets twice daily for 5 days and ritonavir (100 mg) one tablet twice daily for 5 days.    Dispense:  30 tablet     Refill:  0    I have reviewed the patients home medicines and have made adjustments as needed  Problem List /  ED Course: Problem List Items Addressed This Visit   None Visit Diagnoses       COVID-19    -  Primary   Relevant Medications   nirmatrelvir/ritonavir (PAXLOVID, 300/100,) 20 x 150 MG & 10 x 100MG  TBPK                Final diagnoses:  COVID-19    ED Discharge Orders          Ordered    nirmatrelvir/ritonavir (PAXLOVID, 300/100,) 20 x 150 MG & 10 x 100MG  TBPK  2 times daily        10/04/23 0229               Bari Charmaine FALCON, MD 10/04/23 [phone]

## 2023-10-04 NOTE — Discharge Instructions (Addendum)
 You were seen today and tested positive for COVID-19.  Take Paxlovid as prescribed.  If you develop worsening shortness of breath, chest pain, worsening delirium, you should be reevaluated.  Take Tylenol  or small doses of ibuprofen  for fever.  Make sure that you are staying hydrated.  Follow-up with your primary doctor.

## 2023-10-26 ENCOUNTER — Ambulatory Visit (HOSPITAL_BASED_OUTPATIENT_CLINIC_OR_DEPARTMENT_OTHER): Admit: 2023-10-26 | Admitting: Orthopaedic Surgery

## 2023-10-26 ENCOUNTER — Encounter (HOSPITAL_BASED_OUTPATIENT_CLINIC_OR_DEPARTMENT_OTHER): Payer: Self-pay

## 2023-10-26 SURGERY — ARTHROPLASTY, SHOULDER, TOTAL, REVERSE
Anesthesia: General | Site: Shoulder | Laterality: Right

## 2023-10-30 ENCOUNTER — Ambulatory Visit (HOSPITAL_BASED_OUTPATIENT_CLINIC_OR_DEPARTMENT_OTHER): Admitting: Physical Therapy

## 2023-11-06 ENCOUNTER — Encounter (HOSPITAL_BASED_OUTPATIENT_CLINIC_OR_DEPARTMENT_OTHER): Admitting: Physical Therapy

## 2023-11-06 ENCOUNTER — Encounter: Payer: Self-pay | Admitting: Radiology

## 2023-11-08 ENCOUNTER — Encounter (HOSPITAL_BASED_OUTPATIENT_CLINIC_OR_DEPARTMENT_OTHER): Admitting: Student

## 2023-11-13 ENCOUNTER — Encounter (HOSPITAL_BASED_OUTPATIENT_CLINIC_OR_DEPARTMENT_OTHER): Admitting: Physical Therapy

## 2023-11-20 ENCOUNTER — Encounter (HOSPITAL_BASED_OUTPATIENT_CLINIC_OR_DEPARTMENT_OTHER): Admitting: Physical Therapy

## 2023-11-22 ENCOUNTER — Encounter: Payer: Self-pay | Admitting: Physical Therapy

## 2023-11-22 ENCOUNTER — Ambulatory Visit (HOSPITAL_BASED_OUTPATIENT_CLINIC_OR_DEPARTMENT_OTHER)

## 2023-11-22 ENCOUNTER — Ambulatory Visit (INDEPENDENT_AMBULATORY_CARE_PROVIDER_SITE_OTHER): Admitting: Orthopaedic Surgery

## 2023-11-22 ENCOUNTER — Ambulatory Visit: Admitting: Physical Therapy

## 2023-11-22 ENCOUNTER — Ambulatory Visit: Admitting: Neurology

## 2023-11-22 DIAGNOSIS — G8929 Other chronic pain: Secondary | ICD-10-CM

## 2023-11-22 DIAGNOSIS — M25561 Pain in right knee: Secondary | ICD-10-CM

## 2023-11-22 DIAGNOSIS — M25562 Pain in left knee: Secondary | ICD-10-CM | POA: Diagnosis not present

## 2023-11-22 DIAGNOSIS — M25511 Pain in right shoulder: Secondary | ICD-10-CM

## 2023-11-22 MED ORDER — TRIAMCINOLONE ACETONIDE 40 MG/ML IJ SUSP
80.0000 mg | INTRAMUSCULAR | Status: AC | PRN
  Administered 2023-11-22: 80 mg via INTRA_ARTICULAR

## 2023-11-22 MED ORDER — LIDOCAINE HCL 1 % IJ SOLN
4.0000 mL | INTRAMUSCULAR | Status: AC | PRN
  Administered 2023-11-22: 4 mL

## 2023-11-22 NOTE — Progress Notes (Signed)
 Chief Complaint: Bilateral shoulder pain     History of Present Illness:   11/22/2023: Presents today for for discussion of the right shoulder as well as bilateral knees.  He has been having persistent pain particular about the medial aspect of both knees now chronically for several years  TATE ZAGAL is a 73 y.o. male presents today with right worse than left shoulder pain.  He has experienced extremely difficult overhead range of motion for the last several years.  He does endorse a history of injections.  He has trialed anti-inflammatory medications.  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  Unfortunately at this time this is restricting him from doing basic activities of daily living and attempting to stay active.  The right is more symptomatic with more crepitus in the left    PMH/PSH/Family History/Social History/Meds/Allergies:    Past Medical History:  Diagnosis Date  . Benign prostatic hyperplasia   . COVID-19 08/24/2020   As if 09/25/20 all symptoms resolved per pt  . HLD (hyperlipidemia)   . HTN (hypertension)   . Major depressive disorder   . Meniscus degeneration    L knee; has had repair x 2 and injections  . Mild neurocognitive disorder   . Pneumonia   . Psoriasis   . Varicose veins    Past Surgical History:  Procedure Laterality Date  . APPENDECTOMY    . CHOLECYSTECTOMY  2011  . ligation and stripping left  1994  . MENISCUS REPAIR Left 2015  . TRANSURETHRAL RESECTION OF PROSTATE N/A 10/15/2020   Procedure: TRANSURETHRAL RESECTION OF THE PROSTATE (TURP);  Surgeon: Matilda Senior, MD;  Location: [facility];  Service: Urology;  Laterality: N/A;  90 MINS  . VARICOSE VEIN SURGERY  2013   Social History   Socioeconomic History  . Marital status: Widowed    Spouse name: Jerelene  . Number of children: Not on file  . Years of education: 70  . Highest education level: Not on file  Occupational History  . Occupation:  Interior and spatial designer- retired   Tobacco Use  . Smoking status: Never  . Smokeless tobacco: Never  Vaping Use  . Vaping status: Never Used  Substance and Sexual Activity  . Alcohol use: Yes    Comment: 1 beer every once in awhile  . Drug use: No  . Sexual activity: Not on file  Other Topics Concern  . Not on file  Social History Narrative   Lives at home with wife.   Caffeine use: 2-3 cups of coffee a day    Right-handed   Social Drivers of Health   Financial Resource Strain: Low Risk  (02/27/2023)   Received from [facility]   Overall Financial Resource Strain (CARDIA)   . Difficulty of Paying Living Expenses: Not very hard  Food Insecurity: No Food Insecurity (06/20/2023)   Received from [facility]   Hunger Vital Sign   . Within the past 12 months, you worried that your food would run out before you got the money to buy more.: Never true   . Within the past 12 months, the food you bought just didn't last and you didn't have money to get more.: Never true  Transportation Needs: No Transportation Needs (06/24/2023)   Received from [facility] - Transportation   . Lack of Transportation (Medical): No   . Lack of Transportation (Non-Medical): No  Physical Activity: Sufficiently Active (11/29/2022)   Received from [facility]   Exercise Vital Sign   . On average, how many days per week do you engage in moderate to strenuous exercise (like a brisk walk)?: 5 days   . On average, how many minutes do you engage in exercise at this level?: 50 min  Stress: No Stress Concern Present (06/20/2023)   Received from [facility] of Occupational [facility] - Occupational Stress Questionnaire   . Feeling of Stress : Not at all  Social Connections: Moderately Integrated (12/02/2021)   Received from [facility]   Social Network   . How would you rate your social network (family, work, friends)?: Adequate participation with social networks   Family  History  Problem Relation Age of Onset  . Stroke Mother   . Cancer Father    No Known Allergies Current Outpatient Medications  Medication Sig Dispense Refill  . aspirin  EC 325 MG tablet Take 1 tablet (325 mg total) by mouth daily. 14 tablet 0  . donepezil  (ARICEPT ) 10 MG tablet Take 10 mg by mouth at bedtime.    . memantine (NAMENDA) 5 MG tablet Take 5 mg by mouth 2 (two) times daily.    . Multiple Vitamin (MULTIVITAMIN WITH MINERALS) TABS tablet Take 1 tablet by mouth in the morning.    SABRA omeprazole (PRILOSEC) 20 MG capsule Take 20 mg by mouth daily.    . oxyCODONE  (ROXICODONE ) 5 MG immediate release tablet Take 1 tablet (5 mg total) by mouth every 4 (four) hours as needed for severe pain (pain score 7-10) or breakthrough pain. 10 tablet 0  . propranolol ER (INDERAL LA) 60 MG 24 hr capsule Take 60 mg by mouth daily.    . sertraline (ZOLOFT) 50 MG tablet Take 50 mg by mouth daily.    . tiZANidine  (ZANAFLEX ) 4 MG tablet Take 1 tablet (4 mg total) by mouth every 6 (six) hours as needed for muscle spasms. (Patient taking differently: Take 4 mg by mouth every 8 (eight) hours as needed (cramps).) 90 tablet 3  . triamcinolone cream (KENALOG) 0.1 % Apply 1 application topically daily as needed (skin irritation/plaque psoriasis).     No current facility-administered medications for this visit.   No results found.  Review of Systems:   A ROS was performed including pertinent positives and negatives as documented in the HPI.  Physical Exam :   Constitutional: NAD and appears stated age Neurological: Alert and oriented Psych: Appropriate affect and cooperative There were no vitals taken for this visit.   Comprehensive Musculoskeletal Exam:    Right shoulder active forward elevation is to 110 and left shoulder is to 120 in forward elevation.  External rotation at side is to 15 degrees bilaterally.  Internal rotation is to back pocket.  There is positive crepitus with forward elevation on the  right and pain this neurosensory exam is intact  Bilateral knees with tenderness about the medial joint space negative Lachman range of motion is from 0 to 130 degrees with mild crepitus.  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   Assessment and Plan:   73 y.o. 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  At this time he has trialed conservative management without  any relief including anti-inflammatories home strengthening and he is now unable to do activities of daily lifting.  I do ultimately believe he would be a candidate for right shoulder reverse shoulder arthroplasty.  In the meantime we will also plan for a left shoulder glenohumeral injection to hopefully get him some relief while we are considering treatment of the right.  With regard to the knees he is seeking additional ultrasound-guided injections today.  -Plan for right shoulder reverse shoulder arthroplasty   After a lengthy discussion of treatment options, including risks, benefits, alternatives, complications of surgical and nonsurgical conservative options, the patient elected surgical repair.   The patient  is aware of the material risks  and complications including, but not limited to injury to adjacent structures, neurovascular injury, infection, numbness, bleeding, implant failure, thermal burns, stiffness, persistent pain, failure to heal, disease transmission from allograft, need for further surgery, dislocation, anesthetic risks, blood clots, risks of death,and others. The probabilities of surgical success and failure discussed with patient given their particular co-morbidities.The time and  nature of expected rehabilitation and recovery was discussed.The patient's questions were all answered preoperatively.  No barriers to understanding were noted. I explained the natural history of the disease process and Rx rationale.  I explained to the patient what I considered to be reasonable expectations given their personal situation.  The final treatment plan was arrived at through a shared patient decision making process model.     Procedure Note  Patient: Scott Ibarra             Date of Birth: Aug 08, 1950           MRN: [id number]             Visit Date: 11/22/2023  Procedures: Visit Diagnoses:  1. Chronic pain of left knee   2. Chronic pain of right knee     Large Joint Inj: R knee on 11/22/2023 12:27 PM Indications: pain Details: 22 G 1.5 in needle, ultrasound-guided anterior approach  Arthrogram: No  Medications: 4 mL lidocaine  1 %; 80 mg triamcinolone acetonide 40 MG/ML Outcome: tolerated well, no immediate complications Procedure, treatment alternatives, risks and benefits explained, specific risks discussed. Consent was given by the patient. Immediately prior to procedure a time out was called to verify the correct patient, procedure, equipment, support staff and site/side marked as required. Patient was prepped and draped in the usual sterile fashion.    Large Joint Inj: L knee on 11/22/2023 12:27 PM Indications: pain Details: 22 G 1.5 in needle, ultrasound-guided anterior approach  Arthrogram: No  Medications: 4 mL lidocaine  1 %; 80 mg triamcinolone acetonide 40 MG/ML Outcome: tolerated well, no immediate complications Procedure, treatment alternatives, risks and benefits explained, specific risks discussed. Consent was given by the patient. Immediately prior to procedure a time out was called to verify the correct patient, procedure, equipment, support staff and site/side marked as required. Patient was prepped and draped in the usual sterile fashion.          I personally saw and evaluated the patient, and participated in the management and treatment plan.  Elspeth Parker, MD Attending Physician, Orthopedic Surgery  This document was dictated using Dragon voice recognition software. A reasonable attempt at proof reading has been made to minimize errors.

## 2023-11-22 NOTE — Therapy (Signed)
 OUTPATIENT PHYSICAL THERAPY SCREEN @[address]   Patient Name: Scott Ibarra MRN: [id number] DOB:10/17/1950, 73 y.o., male Today's Date: 11/22/2023  END OF SESSION:  PT End of Session - 11/22/23 1050     Visit Number 1    Activity Tolerance Patient tolerated treatment well    Behavior During Therapy [facility] for tasks assessed/performed          Past Medical History:  Diagnosis Date   Benign prostatic hyperplasia    COVID-19 08/24/2020   As if 09/25/20 all symptoms resolved per pt   HLD (hyperlipidemia)    HTN (hypertension)    Major depressive disorder    Meniscus degeneration    L knee; has had repair x 2 and injections   Mild neurocognitive disorder    Pneumonia    Psoriasis    Varicose veins    Past Surgical History:  Procedure Laterality Date   APPENDECTOMY     CHOLECYSTECTOMY  2011   ligation and stripping left  1994   MENISCUS REPAIR Left 2015   TRANSURETHRAL RESECTION OF PROSTATE N/A 10/15/2020   Procedure: TRANSURETHRAL RESECTION OF THE PROSTATE (TURP);  Surgeon: Matilda Senior, MD;  Location: [facility];  Service: Urology;  Laterality: N/A;  90 MINS   VARICOSE VEIN SURGERY  2013   Patient Active Problem List   Diagnosis Date Noted   BPH with urinary obstruction 10/15/2020   Neck pain 05/10/2017   Neck pain on right side 01/10/2017   Cervical dystonia 01/10/2017   Leg edema, right 07/20/2016   ADD (attention deficit disorder) without hyperactivity 10/28/2015   Spasmodic torticollis 10/12/2014   Varicose veins of bilateral lower extremities with other complications 09/20/2011     THERAPY DIAG:  Chronic right shoulder pain  Goal of screen:  This patient was referred to Physical Therapy specialty screen by Elspeth Parker, MD for HEP.   [facility] HEP code:  Access Code: [email or web address] URL: [email or web address] Date: 11/22/2023 Prepared by: Harlene Cordon  Exercises - Seated Scapular Retraction  - 7 x weekly - Seated Shoulder  External Rotation with Dumbbells  - 1 x daily - 7 x weekly - 3 sets - 10 reps - Standing 'L' Stretch at Counter  - 1 x daily - 7 x weekly - 3 sets - 10 reps - Supine Shoulder Flexion Extension AAROM with Dowel  - 1 x daily - 7 x weekly - 3 sets - 10 reps - Wall Push Up  - 1 x daily - 7 x weekly - 3 sets - 10 reps - Standing Bent Over Single Arm Scapular Row with Table Support  - 1 x daily - 7 x weekly - 3 sets - 10 reps - Standing Bent Over Triceps Extension  - 1 x daily - 7 x weekly - 3 sets - 10 reps - Prone Single Arm Shoulder Horizontal Abduction with Scapular Retraction and Palm Down  - 1 x daily - 7 x weekly - 3 sets - 10 reps - Doorway Pec Stretch at 60 Degrees Abduction with Arm Straight  - 1 x daily - 7 x weekly - 3 sets - 10 reps - Doorway Pec Stretch at 90 Degrees Abduction  - 1 x daily - 7 x weekly - 3 sets - 10 reps [email or web address]  Clinical Impression & Plan:  Has been having pain for years. Clem[place] about 2 years ago on shoulder and was able to get back to normal activity. Pt is right handed. Reports N/T into Rt UE occasionally. Had an injection  in the left shoulder but no treatments for the right.  Enjoys doing UE weight lifting- biceps curls. Daughter reports Weight loss of about 120 lb. Is recently retired from working at [employer] stations where he was on his feet a lot.  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 Physical activity is limited by ability to catch his breath.  Difficulty with doing dishes, cooking, cleaning house to get ready for construction.  Demo AROM to 145 with rib flare and significant crepitus. PROM to 160 deg flexion, end range pain. Straight plane MMT flexion and abd 4/5. Equal grip strength demonstrated.   Pt presents with significant crepitus through ROM in all directions. Pt has been able to maintain a good ROM to this point but pain is increasing- resulting in decreased functional use. Strength measurements are 4/5 but painful and  crepitus is notable. Pt will benefit from shoulder arthroplasty in order to return to PLOF but is required by insurance to work with PT for 6 weeks. Pt was provided with HEP to focus on scapular retraction paired with rib depression/core activation, flexibility and proprioception and will re-evaluate PRN. Pt will reach out with any further questions.      Harlene Cordon PT, DPT 11/22/2023, 10:55 AM  [address] [place], [place] [address] [phone]   Note: charges not applied for screen.

## 2023-11-27 ENCOUNTER — Encounter (HOSPITAL_BASED_OUTPATIENT_CLINIC_OR_DEPARTMENT_OTHER)

## 2023-11-28 ENCOUNTER — Ambulatory Visit: Admitting: Diagnostic Neuroimaging

## 2023-11-29 ENCOUNTER — Ambulatory Visit (HOSPITAL_BASED_OUTPATIENT_CLINIC_OR_DEPARTMENT_OTHER): Admitting: Orthopaedic Surgery

## 2023-11-29 ENCOUNTER — Encounter (HOSPITAL_BASED_OUTPATIENT_CLINIC_OR_DEPARTMENT_OTHER): Admitting: Physical Therapy

## 2023-12-04 ENCOUNTER — Encounter (HOSPITAL_BASED_OUTPATIENT_CLINIC_OR_DEPARTMENT_OTHER)

## 2023-12-07 ENCOUNTER — Encounter (HOSPITAL_BASED_OUTPATIENT_CLINIC_OR_DEPARTMENT_OTHER): Admitting: Physical Therapy

## 2024-01-10 ENCOUNTER — Ambulatory Visit (HOSPITAL_BASED_OUTPATIENT_CLINIC_OR_DEPARTMENT_OTHER): Admitting: Orthopaedic Surgery

## 2024-01-10 DIAGNOSIS — M25562 Pain in left knee: Secondary | ICD-10-CM | POA: Diagnosis not present

## 2024-01-10 DIAGNOSIS — M25561 Pain in right knee: Secondary | ICD-10-CM

## 2024-01-10 DIAGNOSIS — G8929 Other chronic pain: Secondary | ICD-10-CM

## 2024-01-10 MED ORDER — LIDOCAINE HCL 1 % IJ SOLN
4.0000 mL | INTRAMUSCULAR | Status: AC | PRN
  Administered 2024-01-10: 4 mL

## 2024-01-10 MED ORDER — TRIAMCINOLONE ACETONIDE 40 MG/ML IJ SUSP
80.0000 mg | INTRAMUSCULAR | Status: AC | PRN
  Administered 2024-01-10: 80 mg via INTRA_ARTICULAR

## 2024-01-10 NOTE — Progress Notes (Signed)
 "   Chief Complaint: Bilateral shoulder pain     History of Present Illness:   01/10/2024: Presents today for for discussion of the right shoulder as well as bilateral knees.  He is seeking an injection for both knees.  He has now trialed 6 weeks of guided therapy program for his right shoulder without relief  Scott Ibarra is a 74 y.o. male presents today with right worse than left shoulder pain.  He has experienced extremely difficult overhead range of motion for the last several years.  He does endorse a history of injections.  He has trialed anti-inflammatory medications.  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  Unfortunately at this time this is restricting him from doing basic activities of daily living and attempting to stay active.  The right is more symptomatic with more crepitus in the left    PMH/PSH/Family History/Social History/Meds/Allergies:    Past Medical History:  Diagnosis Date   Benign prostatic hyperplasia    COVID-19 08/24/2020   As if 09/25/20 all symptoms resolved per pt   HLD (hyperlipidemia)    HTN (hypertension)    Major depressive disorder    Meniscus degeneration    L knee; has had repair x 2 and injections   Mild neurocognitive disorder    Pneumonia    Psoriasis    Varicose veins    Past Surgical History:  Procedure Laterality Date   APPENDECTOMY     CHOLECYSTECTOMY  2011   ligation and stripping left  1994   MENISCUS REPAIR Left 2015   TRANSURETHRAL RESECTION OF PROSTATE N/A 10/15/2020   Procedure: TRANSURETHRAL RESECTION OF THE PROSTATE (TURP);  Surgeon: Matilda Senior, MD;  Location: [facility];  Service: Urology;  Laterality: N/A;  90 MINS   VARICOSE VEIN SURGERY  2013   Social History   Socioeconomic History   Marital status: Widowed    Spouse name: Belinda   Number of children: Not on file   Years of education: 14   Highest education level: Not on file  Occupational History    Occupation: Interior and spatial designer- retired   Tobacco Use   Smoking status: Never   Smokeless tobacco: Never  Vaping Use   Vaping status: Never Used  Substance and Sexual Activity   Alcohol use: Yes    Comment: 1 beer every once in awhile   Drug use: No   Sexual activity: Not on file  Other Topics Concern   Not on file  Social History Narrative   Lives at home with wife.   Caffeine use: 2-3 cups of coffee a day    Right-handed   Social Drivers of Health   Tobacco Use: Low Risk (12/21/2023)   Received from [facility]   Patient History    Smoking Tobacco Use: Never    Smokeless Tobacco Use: Never    Passive Exposure: Never  Financial Resource Strain: Medium Risk (12/07/2023)   Received from [facility]   Overall Financial Resource Strain (CARDIA)    How hard is it for you to pay for the very basics like food, housing, medical care, and heating?: Somewhat hard  Food Insecurity: Food Insecurity Present (12/07/2023)   Received from [facility]   Epic    Within the past 12 months, you worried that your food would run out before you got the money to buy more.: Sometimes true    Within the past 12 months, the food you bought just didn't last and you  didn't have money to get more.: Sometimes true  Transportation Needs: No Transportation Needs (12/07/2023)   Received from [facility]    In the past 12 months, has lack of transportation kept you from medical appointments or from getting medications?: No    In the past 12 months, has lack of transportation kept you from meetings, work, or from getting things needed for daily living?: No  Physical Activity: Inactive (12/07/2023)   Received from [facility]   Exercise Vital Sign    On average, how many days per week do you engage in moderate to strenuous exercise (like a brisk walk)?: 0 days    Minutes of Exercise per Session: Not on file  Stress: Stress Concern Present (12/07/2023)   Received from  [facility] - Occupational Stress Questionnaire    Do you feel stress - tense, restless, nervous, or anxious, or unable to sleep at night because your mind is troubled all the time - these days?: To some extent  Social Connections: Somewhat Isolated (12/07/2023)   Received from [facility]   Social Network    How would you rate your social network (family, work, friends)?: Restricted participation with some degree of social isolation  Depression (PHQ2-9): Not on file  Alcohol Screen: Not on file  Housing: Unknown (12/07/2023)   Received from [facility]    In the last 12 months, was there a time when you were not able to pay the mortgage or rent on time?: No    Number of Times Moved in the Last Year: Not on file    At any time in the past 12 months, were you homeless or living in a shelter (including now)?: No  Utilities: Not At Risk (12/07/2023)   Received from [facility]    In the past 12 months has the electric, gas, oil, or water company threatened to shut off services in your home?: No  Health Literacy: Not on file   Family History  Problem Relation Age of Onset   Stroke Mother    Cancer Father    No Known Allergies Current Outpatient Medications  Medication Sig Dispense Refill   aspirin  EC 325 MG tablet Take 1 tablet (325 mg total) by mouth daily. 14 tablet 0   donepezil  (ARICEPT ) 10 MG tablet Take 10 mg by mouth at bedtime.     memantine (NAMENDA) 5 MG tablet Take 5 mg by mouth 2 (two) times daily.     Multiple Vitamin (MULTIVITAMIN WITH MINERALS) TABS tablet Take 1 tablet by mouth in the morning.     omeprazole (PRILOSEC) 20 MG capsule Take 20 mg by mouth daily.     oxyCODONE  (ROXICODONE ) 5 MG immediate release tablet Take 1 tablet (5 mg total) by mouth every 4 (four) hours as needed for severe pain (pain score 7-10) or breakthrough pain. 10 tablet 0   propranolol ER (INDERAL LA) 60 MG 24 hr  capsule Take 60 mg by mouth daily.     sertraline (ZOLOFT) 50 MG tablet Take 50 mg by mouth daily.     tiZANidine  (ZANAFLEX ) 4 MG tablet Take 1 tablet (4 mg total) by mouth every 6 (six) hours as needed for muscle spasms. (Patient taking differently: Take 4 mg by mouth every 8 (eight) hours as needed (cramps).) 90 tablet 3   triamcinolone  cream (KENALOG ) 0.1 % Apply 1 application topically daily as needed (skin irritation/plaque psoriasis).  No current facility-administered medications for this visit.   No results found.  Review of Systems:   A ROS was performed including pertinent positives and negatives as documented in the HPI.  Physical Exam :   Constitutional: NAD and appears stated age Neurological: Alert and oriented Psych: Appropriate affect and cooperative There were no vitals taken for this visit.   Comprehensive Musculoskeletal Exam:    Right shoulder active forward elevation is to 110 and left shoulder is to 120 in forward elevation.  External rotation at side is to 15 degrees bilaterally.  Internal rotation is to back pocket.  There is positive crepitus with forward elevation on the right and pain this neurosensory exam is intact  Bilateral knees with tenderness about the medial joint space negative Lachman range of motion is from 0 to 130 degrees with mild crepitus.  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   Assessment and Plan:   74 y.o. 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  He has now trialed a guided physical therapy program for 6 weeks without relief on the right shoulder at this time he has trialed conservative management  without any relief including anti-inflammatories home strengthening and he is now unable to do activities of daily lifting.  I do ultimately believe he would be a candidate for right shoulder reverse shoulder arthroplasty.  In the meantime we will also plan for a left shoulder glenohumeral injection to hopefully get him some relief while we are considering treatment of the right.  With regard to the knees he is seeking additional ultrasound-guided injections today.  -Plan for right shoulder reverse shoulder arthroplasty   After a lengthy discussion of treatment options, including risks, benefits, alternatives, complications of surgical and nonsurgical conservative options, the patient elected surgical repair.   The patient  is aware of the material risks  and complications including, but not limited to injury to adjacent structures, neurovascular injury, infection, numbness, bleeding, implant failure, thermal burns, stiffness, persistent pain, failure to heal, disease transmission from allograft, need for further surgery, dislocation, anesthetic risks, blood clots, risks of death,and others. The probabilities of surgical success and failure discussed with patient given their particular co-morbidities.The time and nature of expected rehabilitation and recovery was discussed.The patient's questions were all answered preoperatively.  No barriers to understanding were noted. I explained the natural history of the disease process and Rx rationale.  I explained to the patient what I considered to be reasonable expectations given their personal situation.  The final treatment plan was arrived at through a shared patient decision making process model.     Procedure Note  Patient: Scott Ibarra             Date of Birth: [birth year]           MRN: [id number]             Visit Date: 01/10/2024  Procedures: Visit Diagnoses:  No diagnosis found.   Large Joint Inj: R knee on 01/10/2024 5:12  PM Indications: pain Details: 22 G 1.5 in needle, ultrasound-guided anterior approach  Arthrogram: No  Medications: 4 mL lidocaine  1 %; 80 mg triamcinolone  acetonide 40 MG/ML Outcome: tolerated well, no immediate complications  Procedure, treatment alternatives, risks and benefits explained, specific risks discussed. Consent was given by the patient. Immediately prior to procedure a time out was called to verify the correct patient, procedure, equipment, support staff and site/side marked as required. Patient was prepped and draped in the usual sterile fashion.    Large Joint Inj: L knee on 01/10/2024 5:12 PM Indications: pain Details: 22 G 1.5 in needle, ultrasound-guided anterior approach  Arthrogram: No  Medications: 4 mL lidocaine  1 %; 80 mg triamcinolone  acetonide 40 MG/ML Outcome: tolerated well, no immediate complications Procedure, treatment alternatives, risks and benefits explained, specific risks discussed. Consent was given by the patient. Immediately prior to procedure a time out was called to verify the correct patient, procedure, equipment, support staff and site/side marked as required. Patient was prepped and draped in the usual sterile fashion.         I personally saw and evaluated the patient, and participated in the management and treatment plan.  Elspeth Parker, MD Attending Physician, Orthopedic Surgery  This document was dictated using Dragon voice recognition software. A reasonable attempt at proof reading has been made to minimize errors. "

## 2024-01-11 ENCOUNTER — Ambulatory Visit: Admitting: Diagnostic Neuroimaging

## 2024-01-11 ENCOUNTER — Encounter: Payer: Self-pay | Admitting: Diagnostic Neuroimaging

## 2024-01-11 VITALS — BP 121/70 | HR 69 | Ht 74.0 in | Wt 237.0 lb

## 2024-01-11 DIAGNOSIS — G3184 Mild cognitive impairment, so stated: Secondary | ICD-10-CM | POA: Diagnosis not present

## 2024-01-11 NOTE — Progress Notes (Unsigned)
 "  [facility]  PATIENT: Scott Ibarra DOB: 28-Mar-1950  REFERRING CLINICIAN: Gladystine Erminio CROME, MD HISTORY FROM: patient  REASON FOR VISIT: new consult   HISTORICAL  CHIEF COMPLAINT:  Chief Complaint  Patient presents with   Follow-up    Pt in 6 with daughter Pt here for memory f/u Daughter and pt states short term memory is worse      HISTORY OF PRESENT ILLNESS:   UPDATE (01/11/24, VRP): 74 year old male here for evaluation of short-term memory loss.  Symptoms started around 2017.  Has had neuropsychology testing in the past in 2019 and was diagnosed with mild cognitive impairment.  No significant changes in ADLs.  Has had some issues with depression, anxiety and caregiver stress in the past.   REVIEW OF SYSTEMS: Full 14 system review of systems performed and negative with exception of: as per HPI.  ALLERGIES: Allergies[1]  HOME MEDICATIONS: Outpatient Medications Prior to Visit  Medication Sig Dispense Refill   donepezil  (ARICEPT ) 10 MG tablet Take 10 mg by mouth at bedtime.     memantine (NAMENDA) 5 MG tablet Take 5 mg by mouth 2 (two) times daily.     omeprazole (PRILOSEC) 20 MG capsule Take 20 mg by mouth daily.     propranolol ER (INDERAL LA) 60 MG 24 hr capsule Take 60 mg by mouth daily.     sertraline (ZOLOFT) 50 MG tablet Take 50 mg by mouth daily.     tiZANidine  (ZANAFLEX ) 4 MG tablet Take 4 mg by mouth every 8 (eight) hours as needed for muscle spasms.     triamcinolone  cream (KENALOG ) 0.1 % Apply 1 application topically daily as needed (skin irritation/plaque psoriasis).     tiZANidine  (ZANAFLEX ) 4 MG tablet Take 1 tablet (4 mg total) by mouth every 6 (six) hours as needed for muscle spasms. (Patient taking differently: Take 4 mg by mouth every 8 (eight) hours as needed (cramps).) 90 tablet 3   Multiple Vitamin (MULTIVITAMIN WITH MINERALS) TABS tablet Take 1 tablet by mouth in the morning.     aspirin  EC 325 MG tablet Take 1 tablet (325 mg  total) by mouth daily. 14 tablet 0   oxyCODONE  (ROXICODONE ) 5 MG immediate release tablet Take 1 tablet (5 mg total) by mouth every 4 (four) hours as needed for severe pain (pain score 7-10) or breakthrough pain. 10 tablet 0   No facility-administered medications prior to visit.    PAST MEDICAL HISTORY: Past Medical History:  Diagnosis Date   Benign prostatic hyperplasia    COVID-19 08/24/2020   As if 09/25/20 all symptoms resolved per pt   HLD (hyperlipidemia)    HTN (hypertension)    Major depressive disorder    Meniscus degeneration    L knee; has had repair x 2 and injections   Mild neurocognitive disorder    Pneumonia    Psoriasis    Varicose veins     PAST SURGICAL HISTORY: Past Surgical History:  Procedure Laterality Date   APPENDECTOMY     CHOLECYSTECTOMY  2011   ligation and stripping left  1994   LOBECTOMY Right    2025   MENISCUS REPAIR Left 2015   TRANSURETHRAL RESECTION OF PROSTATE N/A 10/15/2020   Procedure: TRANSURETHRAL RESECTION OF THE PROSTATE (TURP);  Surgeon: Matilda Senior, MD;  Location: [facility];  Service: Urology;  Laterality: N/A;  90 MINS   VARICOSE VEIN SURGERY  2013    FAMILY HISTORY: Family History  Problem Relation Age of Onset  Stroke Mother    Cancer Father    Alzheimer's disease Maternal Aunt    Alzheimer's disease Maternal Uncle     SOCIAL HISTORY: Social History   Socioeconomic History   Marital status: Widowed    Spouse name: Belinda   Number of children: Not on file   Years of education: 14   Highest education level: Not on file  Occupational History   Occupation: Interior and spatial designer- retired   Tobacco Use   Smoking status: Never   Smokeless tobacco: Never  Vaping Use   Vaping status: Never Used  Substance and Sexual Activity   Alcohol use: Yes    Comment: 1 beer every once in awhile   Drug use: No   Sexual activity: Not on file  Other Topics Concern   Not on file  Social History Narrative   Lives at  home with daughter    Caffeine use: 2-3 cups of coffee a day    Right-handed   Retired    Social Drivers of Health   Tobacco Use: Low Risk (01/11/2024)   Patient History    Smoking Tobacco Use: Never    Smokeless Tobacco Use: Never    Passive Exposure: Not on file  Financial Resource Strain: Medium Risk (12/07/2023)   Received from [facility]   Overall Financial Resource Strain (CARDIA)    How hard is it for you to pay for the very basics like food, housing, medical care, and heating?: Somewhat hard  Food Insecurity: Food Insecurity Present (12/07/2023)   Received from [facility]   Epic    Within the past 12 months, you worried that your food would run out before you got the money to buy more.: Sometimes true    Within the past 12 months, the food you bought just didn't last and you didn't have money to get more.: Sometimes true  Transportation Needs: No Transportation Needs (12/07/2023)   Received from [facility]   Epic    In the past 12 months, has lack of transportation kept you from medical appointments or from getting medications?: No    In the past 12 months, has lack of transportation kept you from meetings, work, or from getting things needed for daily living?: No  Physical Activity: Inactive (12/07/2023)   Received from [facility]   Exercise Vital Sign    On average, how many days per week do you engage in moderate to strenuous exercise (like a brisk walk)?: 0 days    Minutes of Exercise per Session: Not on file  Stress: Stress Concern Present (12/07/2023)   Received from [facility] - Occupational Stress Questionnaire    Do you feel stress - tense, restless, nervous, or anxious, or unable to sleep at night because your mind is troubled all the time - these days?: To some extent  Social Connections: Somewhat Isolated (12/07/2023)   Received from [facility]   Social Network    How would you rate your social network  (family, work, friends)?: Restricted participation with some degree of social isolation  Intimate Partner Violence: Not At Risk (12/07/2023)   Received from [facility]   HITS    Over the last 12 months how often did your partner physically hurt you?: Never    Over the last 12 months how often did your partner insult you or talk down to you?: Never    Over the last 12 months how often did your partner threaten you  with physical harm?: Never    Over the last 12 months how often did your partner scream or curse at you?: Never  Depression (PHQ2-9): Not on file  Alcohol Screen: Not on file  Housing: Unknown (12/07/2023)   Received from [facility]    In the last 12 months, was there a time when you were not able to pay the mortgage or rent on time?: No    Number of Times Moved in the Last Year: Not on file    At any time in the past 12 months, were you homeless or living in a shelter (including now)?: No  Utilities: Not At Risk (12/07/2023)   Received from [facility]    In the past 12 months has the electric, gas, oil, or water company threatened to shut off services in your home?: No  Health Literacy: Not on file     PHYSICAL EXAM  GENERAL EXAM/CONSTITUTIONAL: Vitals:  Vitals:   01/11/24 1414  BP: 121/70  Pulse: 69  Weight: 237 lb (107.5 kg)  Height: 6' 2 (1.88 m)   Body mass index is 30.43 kg/m. Wt Readings from Last 3 Encounters:  01/11/24 237 lb (107.5 kg)  07/10/23 226 lb (102.5 kg)  11/27/22 218 lb (98.9 kg)   Patient is in no distress; well developed, nourished and groomed; neck is supple  CARDIOVASCULAR: Examination of carotid arteries is normal; no carotid bruits Regular rate and rhythm, no murmurs Examination of peripheral vascular system by observation and palpation is normal  EYES: Ophthalmoscopic exam of optic discs and posterior segments is normal; no papilledema or hemorrhages No results found.  MUSCULOSKELETAL: Gait, strength, tone,  movements noted in Neurologic exam below  NEUROLOGIC: MENTAL STATUS:     01/11/2024    2:15 PM 07/10/2023   10:31 AM 11/04/2016    8:18 AM  MMSE - Mini Mental State Exam  Orientation to time 5 4 4    Orientation to Place 5 5 5    Registration 3 3 3    Attention/ Calculation 5 5 5    Recall 2 2 3    Language- name 2 objects 2 2 2    Language- repeat 1 1 1   Language- follow 3 step command 2 3 3    Language- read & follow direction 1 1 1    Write a sentence 1 1 1    Copy design 1 1 1    Total score 28 28 29       Data saved with a previous flowsheet row definition   awake, alert, oriented to person, place and time recent and remote memory intact normal attention and concentration language fluent, comprehension intact, naming intact fund of knowledge appropriate  CRANIAL NERVE:  2nd - no papilledema on fundoscopic exam 2nd, 3rd, 4th, 6th - pupils equal and reactive to light, visual fields full to confrontation, extraocular muscles intact, no nystagmus 5th - facial sensation symmetric 7th - facial strength symmetric 8th - hearing intact 9th - palate elevates symmetrically, uvula midline 11th - shoulder shrug symmetric 12th - tongue protrusion midline  MOTOR:  normal bulk and tone, full strength in the BUE, BLE  SENSORY:  normal and symmetric to light touch, temperature, vibration  COORDINATION:  finger-nose-finger, fine finger movements normal  REFLEXES:  deep tendon reflexes present and symmetric  GAIT/STATION:  narrow based gait    DIAGNOSTIC DATA (LABS, IMAGING, TESTING) - I reviewed patient records, labs, notes, testing and imaging myself where available.  Lab Results  Component Value Date  WBC 10.5 10/03/2023   HGB 14.4 10/03/2023   HCT 43.3 10/03/2023   MCV 90.8 10/03/2023   PLT 144 (L) 10/03/2023      Component Value Date/Time   NA 137 10/03/2023 2243   NA 144 08/11/2015 1058   K 3.9 10/03/2023 2243   CL 101 10/03/2023 2243   CO2 23 10/03/2023 2243    GLUCOSE 119 (H) 10/03/2023 2243   BUN 12 10/03/2023 2243   BUN 19 07/10/2023 1126   CREATININE 0.99 10/03/2023 2243   CALCIUM 9.5 10/03/2023 2243   PROT 7.3 01/30/2021 1732   ALBUMIN 4.1 01/30/2021 1732   AST 19 01/30/2021 1732   ALT 18 01/30/2021 1732   ALKPHOS 64 01/30/2021 1732   BILITOT 0.7 01/30/2021 1732   GFRNONAA >60 10/03/2023 2243   GFRAA >60 07/01/2016 1559   No results found for: CHOL, HDL, LDLCALC, LDLDIRECT, TRIG, CHOLHDL No results found for: YHAJ8R Lab Results  Component Value Date   VITAMINB12 407 07/10/2023   Lab Results  Component Value Date   TSH 3.580 07/10/2023   08/28/15 MRI of the brain without contrast shows the following: 1.    There are some scattered T2/FLAIR hyperintense foci in the subcortical and deep white matter of the hemispheres and in the pons consistent with mild chronic microvascular ischemic change, probably within normal limits for age. 2.    Brain volume is normal for age. 3.    No acute findings.   12/08/16 PET scan - Normal cerebral cortical metabolism with no decreased relative cortical metabolism to suggest Alzheimer's disease.  07/26/23 DATscan  - Ioflupane scan within normal limits. No reduced radiotracer activity in basal ganglia to suggest Parkinson's syndrome pathology   ASSESSMENT AND PLAN  74 y.o. year old male here with:   Dx:  1. MCI (mild cognitive impairment)     PLAN:  MILD MEMORY IMPAIRMENT (since ~ 2017; mild cognitive impairment; could be related to aging, stress, mild depression, pain) - may continue donepezil  + memantine (per PCP); may also try coming off to see if still necessary - try to stay active physically and get some exercise (at least 15-30 minutes per day) - eat a nutritious diet with lean protein, plants / vegetables, whole grains; avoid ultra-processed foods - increase social activities, brain stimulation, games, puzzles, hobbies, crafts, arts, music; try new activities; keep it  fun! - aim for at least 7-8 hours sleep per night (or more) - avoid smoking and alcohol - caution with medications, finances, driving - safety / supervision issues reviewed  Return for pending if symptoms worsen or fail to improve, return to PCP.    EDUARD FABIENE HANLON, MD 01/11/2024, 3:27 PM Certified in Neurology, Neurophysiology and Neuroimaging  [facility] [address], [address] [place], [place] [address] [phone]     [1] No Known Allergies  "

## 2024-01-15 ENCOUNTER — Encounter: Payer: Self-pay | Admitting: Diagnostic Neuroimaging

## 2024-02-05 ENCOUNTER — Other Ambulatory Visit (HOSPITAL_BASED_OUTPATIENT_CLINIC_OR_DEPARTMENT_OTHER): Payer: Self-pay | Admitting: Orthopaedic Surgery

## 2024-02-05 DIAGNOSIS — G8929 Other chronic pain: Secondary | ICD-10-CM

## 2024-02-19 ENCOUNTER — Ambulatory Visit (HOSPITAL_BASED_OUTPATIENT_CLINIC_OR_DEPARTMENT_OTHER): Admit: 2024-02-19 | Admitting: Orthopaedic Surgery

## 2024-02-19 ENCOUNTER — Encounter (HOSPITAL_BASED_OUTPATIENT_CLINIC_OR_DEPARTMENT_OTHER): Payer: Self-pay

## 2024-02-24 ENCOUNTER — Ambulatory Visit (HOSPITAL_BASED_OUTPATIENT_CLINIC_OR_DEPARTMENT_OTHER): Admitting: Physical Therapy

## 2024-03-01 ENCOUNTER — Encounter (HOSPITAL_BASED_OUTPATIENT_CLINIC_OR_DEPARTMENT_OTHER): Admitting: Orthopaedic Surgery

## 2024-03-02 ENCOUNTER — Encounter (HOSPITAL_BASED_OUTPATIENT_CLINIC_OR_DEPARTMENT_OTHER): Admitting: Physical Therapy

## 2024-03-09 ENCOUNTER — Encounter (HOSPITAL_BASED_OUTPATIENT_CLINIC_OR_DEPARTMENT_OTHER): Admitting: Physical Therapy

## 2024-03-16 ENCOUNTER — Encounter (HOSPITAL_BASED_OUTPATIENT_CLINIC_OR_DEPARTMENT_OTHER): Admitting: Physical Therapy

## 2024-03-23 ENCOUNTER — Encounter (HOSPITAL_BASED_OUTPATIENT_CLINIC_OR_DEPARTMENT_OTHER): Admitting: Physical Therapy

## 2024-03-30 ENCOUNTER — Encounter (HOSPITAL_BASED_OUTPATIENT_CLINIC_OR_DEPARTMENT_OTHER): Admitting: Physical Therapy

## 2024-04-06 ENCOUNTER — Encounter (HOSPITAL_BASED_OUTPATIENT_CLINIC_OR_DEPARTMENT_OTHER): Admitting: Physical Therapy

## 2024-04-13 ENCOUNTER — Encounter (HOSPITAL_BASED_OUTPATIENT_CLINIC_OR_DEPARTMENT_OTHER): Admitting: Physical Therapy

## 2024-04-20 ENCOUNTER — Encounter (HOSPITAL_BASED_OUTPATIENT_CLINIC_OR_DEPARTMENT_OTHER): Admitting: Physical Therapy

## 2024-04-27 ENCOUNTER — Encounter (HOSPITAL_BASED_OUTPATIENT_CLINIC_OR_DEPARTMENT_OTHER): Admitting: Physical Therapy

## 2024-05-04 ENCOUNTER — Encounter (HOSPITAL_BASED_OUTPATIENT_CLINIC_OR_DEPARTMENT_OTHER): Admitting: Physical Therapy
